# Patient Record
Sex: Female | Born: 1960 | Race: Black or African American | Hispanic: No | Smoking: Never smoker
Health system: Southern US, Community
[De-identification: ages and names within clinical notes are randomized; demographics above are authoritative.]

## PROBLEM LIST (undated history)

## (undated) DIAGNOSIS — J45909 Unspecified asthma, uncomplicated: Secondary | ICD-10-CM

## (undated) HISTORY — PX: DENTAL SURGERY: SHX609

---

## 2004-02-08 ENCOUNTER — Emergency Department: Payer: Self-pay | Admitting: Unknown Physician Specialty

## 2006-07-22 ENCOUNTER — Emergency Department: Payer: Self-pay | Admitting: Emergency Medicine

## 2006-08-05 ENCOUNTER — Emergency Department: Payer: Self-pay | Admitting: Emergency Medicine

## 2007-06-07 ENCOUNTER — Emergency Department: Payer: Self-pay | Admitting: Emergency Medicine

## 2007-08-17 ENCOUNTER — Emergency Department: Payer: Self-pay

## 2007-10-14 ENCOUNTER — Emergency Department: Payer: Self-pay | Admitting: Emergency Medicine

## 2009-04-18 ENCOUNTER — Emergency Department: Payer: Self-pay | Admitting: Emergency Medicine

## 2009-08-15 ENCOUNTER — Emergency Department: Payer: Self-pay | Admitting: Emergency Medicine

## 2010-02-19 ENCOUNTER — Emergency Department: Payer: Self-pay | Admitting: Emergency Medicine

## 2010-07-26 ENCOUNTER — Emergency Department: Payer: Self-pay | Admitting: Internal Medicine

## 2010-10-10 ENCOUNTER — Emergency Department: Payer: Self-pay | Admitting: *Deleted

## 2011-08-13 ENCOUNTER — Emergency Department: Payer: Self-pay | Admitting: Emergency Medicine

## 2011-09-15 ENCOUNTER — Emergency Department: Payer: Self-pay | Admitting: Emergency Medicine

## 2011-09-15 LAB — URINALYSIS, COMPLETE
Bacteria: NONE SEEN
Ketone: NEGATIVE
Leukocyte Esterase: NEGATIVE
Nitrite: NEGATIVE
Protein: 30
RBC,UR: 14 /HPF (ref 0–5)
Specific Gravity: 1.028 (ref 1.003–1.030)
WBC UR: 6 /HPF (ref 0–5)

## 2011-09-17 LAB — URINE CULTURE

## 2012-04-02 ENCOUNTER — Emergency Department: Payer: Self-pay | Admitting: Internal Medicine

## 2012-04-02 LAB — URINALYSIS, COMPLETE
Ketone: NEGATIVE
Nitrite: NEGATIVE
Specific Gravity: 1.01 (ref 1.003–1.030)

## 2012-04-04 LAB — URINE CULTURE

## 2012-05-11 ENCOUNTER — Emergency Department: Payer: Self-pay | Admitting: Emergency Medicine

## 2013-03-23 ENCOUNTER — Emergency Department: Payer: Self-pay | Admitting: Emergency Medicine

## 2013-04-02 ENCOUNTER — Emergency Department: Payer: Self-pay | Admitting: Emergency Medicine

## 2013-11-16 ENCOUNTER — Emergency Department: Payer: Self-pay | Admitting: Emergency Medicine

## 2013-11-16 LAB — URINALYSIS, COMPLETE
Bacteria: NONE SEEN
Bilirubin,UR: NEGATIVE
Blood: NEGATIVE
Glucose,UR: NEGATIVE mg/dL (ref 0–75)
Ketone: NEGATIVE
LEUKOCYTE ESTERASE: NEGATIVE
Nitrite: NEGATIVE
PH: 7 (ref 4.5–8.0)
PROTEIN: NEGATIVE
Specific Gravity: 1.005 (ref 1.003–1.030)
Squamous Epithelial: <1
WBC UR: 1 /HPF (ref 0–5)

## 2013-11-16 LAB — WET PREP, GENITAL

## 2013-11-17 LAB — GC/CHLAMYDIA PROBE AMP

## 2013-11-24 ENCOUNTER — Emergency Department: Payer: Self-pay | Admitting: Student

## 2013-11-24 LAB — URINALYSIS, COMPLETE
Bacteria: NONE SEEN
Bilirubin,UR: NEGATIVE
Glucose,UR: NEGATIVE mg/dL (ref 0–75)
Ketone: NEGATIVE
Leukocyte Esterase: NEGATIVE
Nitrite: NEGATIVE
PH: 7 (ref 4.5–8.0)
Protein: NEGATIVE
RBC,UR: 1 /HPF (ref 0–5)
SPECIFIC GRAVITY: 1.01 (ref 1.003–1.030)
Squamous Epithelial: 1
WBC UR: 1 /HPF (ref 0–5)

## 2014-03-01 ENCOUNTER — Emergency Department: Payer: Self-pay | Admitting: Emergency Medicine

## 2014-04-27 ENCOUNTER — Emergency Department: Payer: Self-pay | Admitting: Emergency Medicine

## 2014-05-24 ENCOUNTER — Emergency Department
Admit: 2014-05-24 | Disposition: A | Discharge status: Home / Self Care | Payer: Self-pay | Admitting: Emergency Medicine

## 2014-08-11 ENCOUNTER — Encounter: Payer: Self-pay | Admitting: Emergency Medicine

## 2014-08-11 ENCOUNTER — Emergency Department
Admission: EM | Admit: 2014-08-11 | Discharge: 2014-08-11 | Disposition: A | Discharge status: Home / Self Care | Payer: 59 | Attending: Student | Admitting: Student

## 2014-08-11 DIAGNOSIS — Z791 Long term (current) use of non-steroidal anti-inflammatories (NSAID): Secondary | ICD-10-CM | POA: Insufficient documentation

## 2014-08-11 DIAGNOSIS — Y92410 Unspecified street and highway as the place of occurrence of the external cause: Secondary | ICD-10-CM | POA: Insufficient documentation

## 2014-08-11 DIAGNOSIS — S39012A Strain of muscle, fascia and tendon of lower back, initial encounter: Secondary | ICD-10-CM | POA: Diagnosis not present

## 2014-08-11 DIAGNOSIS — S161XXA Strain of muscle, fascia and tendon at neck level, initial encounter: Secondary | ICD-10-CM | POA: Diagnosis not present

## 2014-08-11 DIAGNOSIS — S5012XA Contusion of left forearm, initial encounter: Secondary | ICD-10-CM | POA: Insufficient documentation

## 2014-08-11 DIAGNOSIS — Y9389 Activity, other specified: Secondary | ICD-10-CM | POA: Insufficient documentation

## 2014-08-11 DIAGNOSIS — Y998 Other external cause status: Secondary | ICD-10-CM | POA: Insufficient documentation

## 2014-08-11 DIAGNOSIS — S199XXA Unspecified injury of neck, initial encounter: Secondary | ICD-10-CM | POA: Diagnosis present

## 2014-08-11 HISTORY — DX: Unspecified asthma, uncomplicated: J45.909

## 2014-08-11 MED ORDER — TRAMADOL HCL 50 MG PO TABS: 50.0000 mg | ORAL_TABLET | Freq: Four times a day (QID) | ORAL | Status: DC | PRN

## 2014-08-11 MED ORDER — MELOXICAM 15 MG PO TABS: 15.0000 mg | ORAL_TABLET | Freq: Every day | ORAL | Status: DC

## 2014-08-11 MED ORDER — CYCLOBENZAPRINE HCL 10 MG PO TABS: 10.0000 mg | ORAL_TABLET | Freq: Three times a day (TID) | ORAL | Status: DC | PRN
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
## 2014-08-11 NOTE — Discharge Instructions (Signed)

## 2014-08-11 NOTE — ED Notes (Signed)
Pt alert and oriented X4, active, cooperative, pt in NAD. RR even and unlabored, color WNL.  Pt informed to return if any life threatening symptoms occur.   
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
## 2014-08-15 ENCOUNTER — Emergency Department: Payer: 59

## 2014-08-15 ENCOUNTER — Emergency Department
Admission: EM | Admit: 2014-08-15 | Discharge: 2014-08-15 | Disposition: A | Discharge status: Home / Self Care | Payer: 59 | Attending: Emergency Medicine | Admitting: Emergency Medicine

## 2014-08-15 DIAGNOSIS — S199XXD Unspecified injury of neck, subsequent encounter: Secondary | ICD-10-CM | POA: Insufficient documentation

## 2014-08-15 DIAGNOSIS — R52 Pain, unspecified: Secondary | ICD-10-CM

## 2014-08-15 DIAGNOSIS — S3992XD Unspecified injury of lower back, subsequent encounter: Secondary | ICD-10-CM | POA: Insufficient documentation

## 2014-08-15 DIAGNOSIS — S79911D Unspecified injury of right hip, subsequent encounter: Secondary | ICD-10-CM | POA: Diagnosis not present

## 2014-08-15 DIAGNOSIS — Z791 Long term (current) use of non-steroidal anti-inflammatories (NSAID): Secondary | ICD-10-CM | POA: Insufficient documentation

## 2014-08-15 DIAGNOSIS — M549 Dorsalgia, unspecified: Secondary | ICD-10-CM | POA: Diagnosis present

## 2014-08-15 MED ORDER — HYDROCODONE-ACETAMINOPHEN 5-325 MG PO TABS: 1.0000 | ORAL_TABLET | ORAL | Status: DC | PRN

## 2014-08-15 MED ORDER — KETOROLAC TROMETHAMINE 60 MG/2ML IM SOLN
60.0000 mg | Freq: Once | INTRAMUSCULAR | Status: AC
  Administered 2014-08-15: 60 mg via INTRAMUSCULAR

## 2014-08-15 MED ORDER — METHOCARBAMOL 750 MG PO TABS: 750.0000 mg | ORAL_TABLET | Freq: Four times a day (QID) | ORAL | Status: DC

## 2014-08-15 MED ORDER — KETOROLAC TROMETHAMINE 60 MG/2ML IM SOLN
INTRAMUSCULAR | Status: AC
  Filled 2014-08-15: qty 2
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
## 2014-12-21 ENCOUNTER — Emergency Department
Admission: EM | Admit: 2014-12-21 | Discharge: 2014-12-21 | Disposition: A | Discharge status: Home / Self Care | Payer: Self-pay | Attending: Emergency Medicine | Admitting: Emergency Medicine

## 2014-12-21 ENCOUNTER — Encounter: Payer: Self-pay | Admitting: Urgent Care

## 2014-12-21 DIAGNOSIS — Z79899 Other long term (current) drug therapy: Secondary | ICD-10-CM | POA: Insufficient documentation

## 2014-12-21 DIAGNOSIS — K047 Periapical abscess without sinus: Secondary | ICD-10-CM | POA: Insufficient documentation

## 2014-12-21 DIAGNOSIS — Z791 Long term (current) use of non-steroidal anti-inflammatories (NSAID): Secondary | ICD-10-CM | POA: Insufficient documentation

## 2014-12-21 MED ORDER — CLINDAMYCIN HCL 150 MG PO CAPS
300.0000 mg | ORAL_CAPSULE | Freq: Once | ORAL | Status: AC
Start: 2014-12-21 — End: 2014-12-21
  Administered 2014-12-21: 300 mg via ORAL
  Filled 2014-12-21: qty 2

## 2014-12-21 MED ORDER — CLINDAMYCIN HCL 300 MG PO CAPS: 300.0000 mg | ORAL_CAPSULE | Freq: Four times a day (QID) | ORAL | Status: DC

## 2014-12-21 MED ORDER — HYDROCODONE-ACETAMINOPHEN 5-325 MG PO TABS: 1.0000 | ORAL_TABLET | Freq: Three times a day (TID) | ORAL | Status: DC | PRN

## 2014-12-21 NOTE — Discharge Instructions (Signed)
Dental Abscess A dental abscess is a collection of pus in or around a tooth. CAUSES This condition is caused by a bacterial infection around the root of the tooth that involves the inner part of the tooth (pulp). It may result from:  Severe tooth decay.  Trauma to the tooth that allows bacteria to enter into the pulp, such as a broken or chipped tooth.  Severe gum disease around a tooth. SYMPTOMS Symptoms of this condition include:  Severe pain in and around the infected tooth.  Swelling and redness around the infected tooth, in the mouth, or in the face.  Tenderness.  Pus drainage.  Bad breath.  Bitter taste in the mouth.  Difficulty swallowing.  Difficulty opening the mouth.  Nausea.  Vomiting.  Chills.  Swollen neck glands.  Fever. DIAGNOSIS This condition is diagnosed with examination of the infected tooth. During the exam, your dentist may tap on the infected tooth. Your dentist will also ask about your medical and dental history and may order X-rays. TREATMENT This condition is treated by eliminating the infection. This may be done with:  Antibiotic medicine.  A root canal. This may be performed to save the tooth.  Pulling (extracting) the tooth. This may also involve draining the abscess. This is done if the tooth cannot be saved. HOME CARE INSTRUCTIONS  Take medicines only as directed by your dentist.  If you were prescribed antibiotic medicine, finish all of it even if you start to feel better.  Rinse your mouth (gargle) often with salt water to relieve pain or swelling.  Do not drive or operate heavy machinery while taking pain medicine.  Do not apply heat to the outside of your mouth.  Keep all follow-up visits as directed by your dentist. This is important. SEEK MEDICAL CARE IF:  Your pain is worse and is not helped by medicine. SEEK IMMEDIATE MEDICAL CARE IF:  You have a fever or chills.  Your symptoms suddenly get worse.  You have a  very bad headache.  You have problems breathing or swallowing.  You have trouble opening your mouth.  You have swelling in your neck or around your eye.   This information is not intended to replace advice given to you by your health care provider. Make sure you discuss any questions you have with your health care provider.   Document Released: 02/04/2005 Document Revised: 06/21/2014 Document Reviewed: 02/01/2014 Elsevier Interactive Patient Education [employer]2016 [employer]   Take the antibiotic as directed, until completely gone. Apply warm compresses to reduce swelling. Follow-up with your dental provider or primary care provider for further care.

## 2014-12-21 NOTE — ED Notes (Signed)
Patient presents with c/o RIGHT side otalgia and reports of fullness x 2 months. (+) facial swelling noted. Patient reports tooth extraction with subsequent extraction x 4 months ago. Denies fever. Patient states, "It feels like there is something moving in my ear and it makes me dizzy sometimes. It is so much worse tonight."

## 2014-12-21 NOTE — ED Provider Notes (Signed)
[facility] Emergency Department Provider Note ____________________________________________  Time seen: 2305  I have reviewed the triage vital signs and the nursing notes.  HISTORY  Chief Complaint  Otalgia  HPI Debbie Leon is a 54 y.o. female reports to the ED with complaints of right sided ear pain intermittently and fullness over the last 2 months. She is also noticed some sudden onset of facial swelling this evening.She describes a fullness in the ear and intermittent sensation of fluid in the eardrum. Denies any discharge from the ear, or hearing changes. She also denies any significant fevers, chills, or sweats. Of significance is that she had a dental extraction about 4 months prior, and does note that it was a difficult extraction. She speculates that she may even have had a piece of tooth left in her lower jaw. She rates the pain which originates in her lower right jaw about a 10 out of 10. She also notes swelling to the right side of the face towards the ear.  Past Medical History  Diagnosis Date  . Asthma     There are no active problems to display for this patient.   History reviewed. No pertinent past surgical history.  Current Outpatient Rx  Name  Route  Sig  Dispense  Refill  . clindamycin (CLEOCIN) 300 MG capsule   Oral   Take 1 capsule (300 mg total) by mouth 4 (four) times daily.   40 capsule   0   . cyclobenzaprine (FLEXERIL) 10 MG tablet   Oral   Take 1 tablet (10 mg total) by mouth 3 (three) times daily as needed for muscle spasms.   30 tablet   0   . HYDROcodone-acetaminophen (NORCO) 5-325 MG tablet   Oral   Take 1 tablet by mouth every 8 (eight) hours as needed for moderate pain.   10 tablet   0   . meloxicam (MOBIC) 15 MG tablet   Oral   Take 1 tablet (15 mg total) by mouth daily.   30 tablet   2   . methocarbamol (ROBAXIN) 750 MG tablet   Oral   Take 1 tablet (750 mg total) by mouth 4 (four) times daily.   40  tablet   0     Stop the cyclobenzaprine.   . traMADol (ULTRAM) 50 MG tablet   Oral   Take 1 tablet (50 mg total) by mouth every 6 (six) hours as needed.   9 tablet   0    Allergies Dye fdc red  No family history on file.  Social History Social History  Substance Use Topics  . Smoking status: Never Smoker   . Smokeless tobacco: None  . Alcohol Use: No   Review of Systems  Constitutional: Negative for fever. Eyes: Negative for visual changes. ENT: Negative for sore throat. Right ear ache and facial swelling as above. Cardiovascular: Negative for chest pain. Respiratory: Negative for shortness of breath. Gastrointestinal: Negative for abdominal pain, vomiting and diarrhea. Genitourinary: Negative for dysuria. Musculoskeletal: Negative for back pain. Skin: Negative for rash. Neurological: Negative for headaches, focal weakness or numbness. ____________________________________________  PHYSICAL EXAM:  VITAL SIGNS: ED Triage Vitals  Enc Vitals Group     BP 12/21/14 2227 141/78 mmHg     Pulse Rate 12/21/14 2227 66     Resp 12/21/14 2227 16     Temp 12/21/14 2227 97.8 F (36.6 C)     Temp Source 12/21/14 2227 Oral     SpO2 12/21/14  2227 100 %     Weight 12/21/14 2227 190 lb (86.183 kg)     Height 12/21/14 2227 5\' 5"  (1.651 m)     Head Cir --      Peak Flow --      Pain Score 12/21/14 2229 10     Pain Loc --      Pain Edu? --      Excl. in GC? --    Constitutional: Alert and oriented. Well appearing and in no distress. Head: Normocephalic and atraumatic, except for mild right-sided facial swelling from the lower jaw to the zygomatic cheek.      Eyes: Conjunctivae are normal. PERRL. Normal extraocular movements      Ears: Canals clear. TMs intact bilaterally. No effusion or bulging noted.   Nose: No congestion/rhinorrhea.   Mouth/Throat: Mucous membranes are moist. Edentulous lower right jaw from the canine back. Mild tenderness to the lower right buccal  mucosa.    Neck: Supple. No thyromegaly. Hematological/Lymphatic/Immunological: No cervical lymphadenopathy. Cardiovascular: Normal rate, regular rhythm.  Respiratory: Normal respiratory effort. No wheezes/rales/rhonchi. Gastrointestinal: Soft and nontender. No distention. Musculoskeletal: Nontender with normal range of motion in all extremities.  Neurologic:  Normal gait without ataxia. Normal speech and language. No gross focal neurologic deficits are appreciated. Skin:  Skin is warm, dry and intact. No rash noted. Psychiatric: Mood and affect are normal. Patient exhibits appropriate insight and judgment. ____________________________________________  PROCEDURES  Clindamycin 300 mg PO ____________________________________________  INITIAL IMPRESSION / ASSESSMENT AND PLAN / ED COURSE  Acute dental abscess on presentation. Patient will be discharged with prescription for clindamycin dose as directed. She is encouraged to follow up with her dental provider for reevaluation and panoramic x-rays. She cried with the prescription for Norco to dose as needed for acute pain. Return instructions are provided. ____________________________________________  FINAL CLINICAL IMPRESSION(S) / ED DIAGNOSES  Final diagnoses:  Dental abscess      Lissa HoardJenise V Bacon Kelsye Loomer, PA-C 12/21/14 2359  Darien Ramusavid W Kaminski, MD 12/26/14 1356

## 2014-12-28 ENCOUNTER — Emergency Department
Admission: EM | Admit: 2014-12-28 | Discharge: 2014-12-28 | Disposition: A | Discharge status: Home / Self Care | Payer: Self-pay | Attending: Emergency Medicine | Admitting: Emergency Medicine

## 2014-12-28 ENCOUNTER — Encounter: Payer: Self-pay | Admitting: *Deleted

## 2014-12-28 DIAGNOSIS — N390 Urinary tract infection, site not specified: Secondary | ICD-10-CM | POA: Insufficient documentation

## 2014-12-28 DIAGNOSIS — Z79899 Other long term (current) drug therapy: Secondary | ICD-10-CM | POA: Insufficient documentation

## 2014-12-28 DIAGNOSIS — Z791 Long term (current) use of non-steroidal anti-inflammatories (NSAID): Secondary | ICD-10-CM | POA: Insufficient documentation

## 2014-12-28 DIAGNOSIS — Z792 Long term (current) use of antibiotics: Secondary | ICD-10-CM | POA: Insufficient documentation

## 2014-12-28 DIAGNOSIS — R319 Hematuria, unspecified: Secondary | ICD-10-CM

## 2014-12-28 DIAGNOSIS — R300 Dysuria: Secondary | ICD-10-CM

## 2014-12-28 LAB — URINALYSIS COMPLETE WITH MICROSCOPIC (ARMC ONLY)
Bilirubin Urine: NEGATIVE
Glucose, UA: NEGATIVE mg/dL
Ketones, ur: NEGATIVE mg/dL
Nitrite: NEGATIVE
PH: 5 (ref 5.0–8.0)
PROTEIN: 100 mg/dL — AB
SPECIFIC GRAVITY, URINE: 1.02 (ref 1.005–1.030)

## 2014-12-28 MED ORDER — CIPROFLOXACIN HCL 500 MG PO TABS
500.0000 mg | ORAL_TABLET | Freq: Once | ORAL | Status: AC
  Administered 2014-12-28: 500 mg via ORAL
  Filled 2014-12-28: qty 1

## 2014-12-28 MED ORDER — CIPROFLOXACIN HCL 500 MG PO TABS: 500.0000 mg | ORAL_TABLET | Freq: Two times a day (BID) | ORAL | Status: DC

## 2014-12-28 NOTE — ED Notes (Addendum)
Pt reports blood in urine tonight.  Pt has dysuria.  No back pain.

## 2014-12-28 NOTE — Discharge Instructions (Signed)
1. Take antibiotic as prescribed (Cipro 500 mg twice daily 7 days). 2. Return to the ER for worsening symptoms, persistent vomiting, fever or other concerns.  Dysuria Dysuria is pain or discomfort while urinating. The pain or discomfort may be felt in the tube that carries urine out of the bladder (urethra) or in the surrounding tissue of the genitals. The pain may also be felt in the groin area, lower abdomen, and lower back. You may have to urinate frequently or have the sudden feeling that you have to urinate (urgency). Dysuria can affect both men and women, but is more common in women. Dysuria can be caused by many different things, including:  Urinary tract infection in women.  Infection of the kidney or bladder.  Kidney stones or bladder stones.  Certain sexually transmitted infections (STIs), such as chlamydia.  Dehydration.  Inflammation of the vagina.  Use of certain medicines.  Use of certain soaps or scented products that cause irritation. HOME CARE INSTRUCTIONS Watch your dysuria for any changes. The following actions may help to reduce any discomfort you are feeling:  Drink enough fluid to keep your urine clear or pale yellow.  Empty your bladder often. Avoid holding urine for long periods of time.  After a bowel movement or urination, women should cleanse from front to back, using each tissue only once.  Empty your bladder after sexual intercourse.  Take medicines only as directed by your health care provider.  If you were prescribed an antibiotic medicine, finish it all even if you start to feel better.  Avoid caffeine, tea, and alcohol. They can irritate the bladder and make dysuria worse. In men, alcohol may irritate the prostate.  Keep all follow-up visits as directed by your health care provider. This is important.  If you had any tests done to find the cause of dysuria, it is your responsibility to obtain your test results. Ask the lab or department  performing the test when and how you will get your results. Talk with your health care provider if you have any questions about your results. SEEK MEDICAL CARE IF:  You develop pain in your back or sides.  You have a fever.  You have nausea or vomiting.  You have blood in your urine.  You are not urinating as often as you usually do. SEEK IMMEDIATE MEDICAL CARE IF:  You pain is severe and not relieved with medicines.  You are unable to hold down any fluids.  You or someone else notices a change in your mental function.  You have a rapid heartbeat at rest.  You have shaking or chills.  You feel extremely weak.   This information is not intended to replace advice given to you by your health care provider. Make sure you discuss any questions you have with your health care provider.   Document Released: 11/03/2003 Document Revised: 02/25/2014 Document Reviewed: 09/30/2013 Elsevier Interactive Patient Education 2016 [employer]  Urinary Tract Infection Urinary tract infections (UTIs) can develop anywhere along your urinary tract. Your urinary tract is your body's drainage system for removing wastes and extra water. Your urinary tract includes two kidneys, two ureters, a bladder, and a urethra. Your kidneys are a pair of bean-shaped organs. Each kidney is about the size of your fist. They are located below your ribs, one on each side of your spine. CAUSES Infections are caused by microbes, which are microscopic organisms, including fungi, viruses, and bacteria. These organisms are so small that they can only be  seen through a microscope. Bacteria are the microbes that most commonly cause UTIs. SYMPTOMS  Symptoms of UTIs may vary by age and gender of the patient and by the location of the infection. Symptoms in young women typically include a frequent and intense urge to urinate and a painful, burning feeling in the bladder or urethra during urination. Older women and men are more  likely to be tired, shaky, and weak and have muscle aches and abdominal pain. A fever may mean the infection is in your kidneys. Other symptoms of a kidney infection include pain in your back or sides below the ribs, nausea, and vomiting. DIAGNOSIS To diagnose a UTI, your caregiver will ask you about your symptoms. Your caregiver will also ask you to provide a urine sample. The urine sample will be tested for bacteria and white blood cells. White blood cells are made by your body to help fight infection. TREATMENT  Typically, UTIs can be treated with medication. Because most UTIs are caused by a bacterial infection, they usually can be treated with the use of antibiotics. The choice of antibiotic and length of treatment depend on your symptoms and the type of bacteria causing your infection. HOME CARE INSTRUCTIONS  If you were prescribed antibiotics, take them exactly as your caregiver instructs you. Finish the medication even if you feel better after you have only taken some of the medication.  Drink enough water and fluids to keep your urine clear or pale yellow.  Avoid caffeine, tea, and carbonated beverages. They tend to irritate your bladder.  Empty your bladder often. Avoid holding urine for long periods of time.  Empty your bladder before and after sexual intercourse.  After a bowel movement, women should cleanse from front to back. Use each tissue only once. SEEK MEDICAL CARE IF:   You have back pain.  You develop a fever.  Your symptoms do not begin to resolve within 3 days. SEEK IMMEDIATE MEDICAL CARE IF:   You have severe back pain or lower abdominal pain.  You develop chills.  You have nausea or vomiting.  You have continued burning or discomfort with urination. MAKE SURE YOU:   Understand these instructions.  Will watch your condition.  Will get help right away if you are not doing well or get worse.   This information is not intended to replace advice given to  you by your health care provider. Make sure you discuss any questions you have with your health care provider.   Document Released: 11/14/2004 Document Revised: 10/26/2014 Document Reviewed: 03/15/2011 Elsevier Interactive Patient Education [employer]

## 2014-12-28 NOTE — ED Provider Notes (Signed)
[facility] Emergency Department Provider Note  ____________________________________________  Time seen: Approximately 3:22 AM  I have reviewed the triage vital signs and the nursing notes.   HISTORY  Chief Complaint Hematuria   HPI Debbie Leon is a 54 y.o. female who presents to the ED from home with a chief complaint of hematuria.Patient reports symptoms of dysuria and hematuria x one day. Symptoms associated with suprapubic discomfort. Patient denies fever, chills, flank pain, chest pain, nausea, vomiting, shortness of breath. Denies use of anticoagulants. Denies recent travel or trauma. Nothing makes her symptoms better or worse.   Past Medical History  Diagnosis Date  . Asthma     There are no active problems to display for this patient.   No past surgical history on file.  Current Outpatient Rx  Name  Route  Sig  Dispense  Refill  . clindamycin (CLEOCIN) 300 MG capsule   Oral   Take 1 capsule (300 mg total) by mouth 4 (four) times daily.   40 capsule   0   . cyclobenzaprine (FLEXERIL) 10 MG tablet   Oral   Take 1 tablet (10 mg total) by mouth 3 (three) times daily as needed for muscle spasms.   30 tablet   0   . HYDROcodone-acetaminophen (NORCO) 5-325 MG tablet   Oral   Take 1 tablet by mouth every 8 (eight) hours as needed for moderate pain.   10 tablet   0   . meloxicam (MOBIC) 15 MG tablet   Oral   Take 1 tablet (15 mg total) by mouth daily.   30 tablet   2   . methocarbamol (ROBAXIN) 750 MG tablet   Oral   Take 1 tablet (750 mg total) by mouth 4 (four) times daily.   40 tablet   0     Stop the cyclobenzaprine.   . traMADol (ULTRAM) 50 MG tablet   Oral   Take 1 tablet (50 mg total) by mouth every 6 (six) hours as needed.   9 tablet   0     Allergies Dye fdc red  No family history on file.  Social History Social History  Substance Use Topics  . Smoking status: Never Smoker   . Smokeless tobacco: None   . Alcohol Use: No    Review of Systems Constitutional: No fever/chills Eyes: No visual changes. ENT: No sore throat. Cardiovascular: Denies chest pain. Respiratory: Denies shortness of breath. Gastrointestinal: Positive for suprapubic abdominal pain.  No nausea, no vomiting.  No diarrhea.  No constipation. Genitourinary: Positive for dysuria. Musculoskeletal: Negative for back pain. Skin: Negative for rash. Neurological: Negative for headaches, focal weakness or numbness.  10-point ROS otherwise negative.  ____________________________________________   PHYSICAL EXAM:  VITAL SIGNS: ED Triage Vitals  Enc Vitals Group     BP 12/28/14 0031 140/85 mmHg     Pulse Rate 12/28/14 0031 76     Resp --      Temp 12/28/14 0031 97.7 F (36.5 C)     Temp Source 12/28/14 0031 Oral     SpO2 12/28/14 0031 100 %     Weight 12/28/14 0031 190 lb (86.183 kg)     Height 12/28/14 0031 5' 5.5" (1.664 m)     Head Cir --      Peak Flow --      Pain Score 12/28/14 0037 10     Pain Loc --      Pain Edu? --  Excl. in GC? --     Constitutional: Alert and oriented. Well appearing and in no acute distress. Eyes: Conjunctivae are normal. PERRL. EOMI. Head: Atraumatic. Nose: No congestion/rhinnorhea. Mouth/Throat: Mucous membranes are moist.  Oropharynx non-erythematous. Neck: No stridor.   Cardiovascular: Normal rate, regular rhythm. Grossly normal heart sounds.  Good peripheral circulation. Respiratory: Normal respiratory effort.  No retractions. Lungs CTAB. Gastrointestinal: Soft and nontender. No distention. No abdominal bruits. No CVA tenderness. Musculoskeletal: No lower extremity tenderness nor edema.  No joint effusions. Neurologic:  Normal speech and language. No gross focal neurologic deficits are appreciated. No gait instability. Skin:  Skin is warm, dry and intact. No rash noted. Psychiatric: Mood and affect are normal. Speech and behavior are  normal.  ____________________________________________   LABS (all labs ordered are listed, but only abnormal results are displayed)  Labs Reviewed  URINALYSIS COMPLETEWITH MICROSCOPIC ([facility] ONLY) - Abnormal; Notable for the following:    Color, Urine RED (*)    APPearance CLOUDY (*)    Hgb urine dipstick 2+ (*)    Protein, ur 100 (*)    Leukocytes, UA 1+ (*)    Bacteria, UA MANY (*)    Squamous Epithelial / LPF 6-30 (*)    All other components within normal limits   ____________________________________________  EKG  None ____________________________________________  RADIOLOGY  None ____________________________________________   PROCEDURES  Procedure(s) performed: None  Critical Care performed: No  ____________________________________________   INITIAL IMPRESSION / ASSESSMENT AND PLAN / ED COURSE  Pertinent labs & imaging results that were available during my care of the patient were reviewed by me and considered in my medical decision making (see chart for details).  54 year old who presents with hematuria. Finishing clindamycin for unrelated dental abscess diagnosed last week. Clinical presentation atypical for nephrolithiasis. Will initiate antibiotic treatment with Cipro. Strict return precautions given. Patient verbalizes understanding and agrees with plan of care. ____________________________________________   FINAL CLINICAL IMPRESSION(S) / ED DIAGNOSES  Final diagnoses:  Hematuria  UTI (lower urinary tract infection)  Dysuria      Irean Hong, MD 12/28/14 [phone]

## 2015-02-23 ENCOUNTER — Emergency Department
Admission: EM | Admit: 2015-02-23 | Discharge: 2015-02-23 | Disposition: A | Discharge status: Home / Self Care | Payer: Self-pay | Attending: Emergency Medicine | Admitting: Emergency Medicine

## 2015-02-23 ENCOUNTER — Encounter: Payer: Self-pay | Admitting: Emergency Medicine

## 2015-02-23 DIAGNOSIS — R21 Rash and other nonspecific skin eruption: Secondary | ICD-10-CM | POA: Insufficient documentation

## 2015-02-23 DIAGNOSIS — Z79899 Other long term (current) drug therapy: Secondary | ICD-10-CM | POA: Insufficient documentation

## 2015-02-23 DIAGNOSIS — Z791 Long term (current) use of non-steroidal anti-inflammatories (NSAID): Secondary | ICD-10-CM | POA: Insufficient documentation

## 2015-02-23 DIAGNOSIS — Z792 Long term (current) use of antibiotics: Secondary | ICD-10-CM | POA: Insufficient documentation

## 2015-02-23 MED ORDER — PREDNISONE 10 MG (21) PO TBPK: ORAL_TABLET | ORAL | Status: DC

## 2015-02-23 MED ORDER — FAMOTIDINE 20 MG PO TABS: 20.0000 mg | ORAL_TABLET | Freq: Every day | ORAL | Status: DC

## 2015-02-23 MED ORDER — FAMOTIDINE 20 MG PO TABS
20.0000 mg | ORAL_TABLET | Freq: Once | ORAL | Status: AC
  Administered 2015-02-23: 20 mg via ORAL
  Filled 2015-02-23: qty 1

## 2015-02-23 MED ORDER — DIAZEPAM 2 MG PO TABS: 2.0000 mg | ORAL_TABLET | Freq: Three times a day (TID) | ORAL | Status: DC | PRN

## 2015-02-23 MED ORDER — DIAZEPAM 2 MG PO TABS
2.0000 mg | ORAL_TABLET | Freq: Once | ORAL | Status: AC
  Administered 2015-02-23: 2 mg via ORAL
  Filled 2015-02-23: qty 1

## 2015-02-23 MED ORDER — DEXAMETHASONE SODIUM PHOSPHATE 10 MG/ML IJ SOLN
10.0000 mg | Freq: Once | INTRAMUSCULAR | Status: AC
  Administered 2015-02-23: 10 mg via INTRAMUSCULAR
  Filled 2015-02-23: qty 1

## 2015-02-23 NOTE — ED Notes (Signed)
Pt states she slept on friends couch and that she developed rash.  Pt states she also accidentally ate shrimp which she is allergic to when this happened as well.  Pt states she has been taking benadryl.

## 2015-02-23 NOTE — ED Provider Notes (Signed)
[facility] Emergency Department Provider Note ____________________________________________  Time seen: Approximately 10:02 PM  I have reviewed the triage vital signs and the nursing notes.   HISTORY  Chief Complaint Rash   HPI Debbie Leon is a 55 y.o. female who presents to the emergency department for evaluation of a rash that started on Saturday. She states that she slept on someone's couch and then awakened with an itchy rash to her neck. 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 She denies difficulty breathing, throat itching or swelling, or nausea/vomiting. She has been taking Benadryl without relief.   Past Medical History  Diagnosis Date  . Asthma     There are no active problems to display for this patient.   History reviewed. No pertinent past surgical history.  Current Outpatient Rx  Name  Route  Sig  Dispense  Refill  . ciprofloxacin (CIPRO) 500 MG tablet   Oral   Take 1 tablet (500 mg total) by mouth 2 (two) times daily.   14 tablet   0   . clindamycin (CLEOCIN) 300 MG capsule   Oral   Take 1 capsule (300 mg total) by mouth 4 (four) times daily.   40 capsule   0   . cyclobenzaprine (FLEXERIL) 10 MG tablet   Oral   Take 1 tablet (10 mg total) by mouth 3 (three) times daily as needed for muscle spasms.   30 tablet   0   . diazepam (VALIUM) 2 MG tablet   Oral   Take 1 tablet (2 mg total) by mouth every 8 (eight) hours as needed.   12 tablet   0   . famotidine (PEPCID) 20 MG tablet   Oral   Take 1 tablet (20 mg total) by mouth daily.   14 tablet   0   . HYDROcodone-acetaminophen (NORCO) 5-325 MG tablet   Oral   Take 1 tablet by mouth every 8 (eight) hours as needed for moderate pain.   10 tablet   0   . meloxicam (MOBIC) 15 MG tablet   Oral   Take 1 tablet (15 mg total) by mouth daily.   30 tablet    2   . methocarbamol (ROBAXIN) 750 MG tablet   Oral   Take 1 tablet (750 mg total) by mouth 4 (four) times daily.   40 tablet   0     Stop the cyclobenzaprine.   . predniSONE (STERAPRED UNI-PAK 21 TAB) 10 MG (21) TBPK tablet      Take 6 tablets on day 1 Take 5 tablets on day 2 Take 4 tablets on day 3 Take 3 tablets on day 4 Take 2 tablets on day 5 Take 1 tablet on day 6   21 tablet   0   . traMADol (ULTRAM) 50 MG tablet   Oral   Take 1 tablet (50 mg total) by mouth every 6 (six) hours as needed.   9 tablet   0     Allergies Dye fdc red and Shrimp  History reviewed. No pertinent family history.  Social History Social History  Substance Use Topics  . Smoking status: Never Smoker   . Smokeless tobacco: None  . Alcohol Use: No    Review of Systems   Constitutional: No fever/chills Eyes: No visual changes. ENT: No congestion or rhinorrhea Cardiovascular: Denies chest pain.  Respiratory: Denies shortness of breath. Gastrointestinal: No abdominal pain.  No nausea, no vomiting.  No diarrhea.  No constipation. Genitourinary: Negative for dysuria. Musculoskeletal: Negative for back pain. Skin: Positive for rash Neurological: Negative for headaches, focal weakness or numbness.  10-point ROS otherwise negative.  ____________________________________________   PHYSICAL EXAM:  VITAL SIGNS: ED Triage Vitals  Enc Vitals Group     BP 02/23/15 2143 141/108 mmHg     Pulse Rate 02/23/15 2143 82     Resp 02/23/15 2143 18     Temp 02/23/15 2143 97.9 F (36.6 C)     Temp Source 02/23/15 2143 Oral     SpO2 02/23/15 2143 100 %     Weight 02/23/15 2143 195 lb (88.451 kg)     Height 02/23/15 2143 5\' 5"  (1.651 m)     Head Cir --      Peak Flow --      Pain Score --      Pain Loc --      Pain Edu? --      Excl. in GC? --     Constitutional: Alert and oriented. Well appearing and in no acute distress. Eyes: Conjunctivae are normal. PERRL. EOMI. Head:  Atraumatic. Nose: No congestion/rhinnorhea. Mouth/Throat: Mucous membranes are moist.  Oropharynx non-erythematous. No oral lesions. Neck: No stridor. Cardiovascular: Normal rate, regular rhythm.  Good peripheral circulation. Respiratory: Normal respiratory effort.  No retractions. Lungs CTAB. Gastrointestinal: Soft and nontender. No distention. No abdominal bruits.  Musculoskeletal: No lower extremity tenderness nor edema.  No joint effusions. Neurologic:  Normal speech and language. No gross focal neurologic deficits are appreciated. Speech is normal. No gait instability. Skin:  Excoriated areas noted to the neck, chest wall, and abdomen. Unable to identify a new lesion, therefore diagnosis is difficult.; Negative for petechiae.  Psychiatric: Mood and affect are normal. Speech and behavior are normal.  ____________________________________________   LABS (all labs ordered are listed, but only abnormal results are displayed)  Labs Reviewed - No data to display ____________________________________________  EKG   ____________________________________________  RADIOLOGY   ____________________________________________   PROCEDURES  Procedure(s) performed: None ____________________________________________   INITIAL IMPRESSION / ASSESSMENT AND PLAN / ED COURSE  Pertinent labs & imaging results that were available during my care of the patient were reviewed by me and considered in my medical decision making (see chart for details).  Patient was advised that this is either a contact dermatitis from something that was on the couch or a mild allergic reaction to the shrimp that she had eaten the night prior to onset of the rash. She was advised to take the Valium, Pepcid, and redness around as prescribed. 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 She was advised to follow-up with the dermatologist for symptoms that are not improving over the next several  days. She was advised to return to the emergency department for symptoms that change or worsen if she is unable to schedule an appointment. ____________________________________________   FINAL CLINICAL IMPRESSION(S) / ED DIAGNOSES  Final diagnoses:  Rash and nonspecific skin eruption       Chinita Pester, FNP 02/23/15 2234  Sharman Cheek, MD 02/23/15 [phone]

## 2015-02-23 NOTE — ED Notes (Signed)
Pt c/o rash that started Saturday.  Has been itching.  Has tried benadryl OTC.  No fevers.

## 2015-02-23 NOTE — ED Notes (Signed)
Pt discharged to home.  Family member driving.  Discharge instructions reviewed.  Verbalized understanding.  No questions or concerns at this time.  Teach back verified.  Pt in NAD.  No items left in ED.   

## 2015-02-23 NOTE — Discharge Instructions (Signed)

## 2015-04-19 ENCOUNTER — Encounter: Payer: Self-pay | Admitting: Emergency Medicine

## 2015-04-19 ENCOUNTER — Emergency Department: Payer: BLUE CROSS/BLUE SHIELD

## 2015-04-19 ENCOUNTER — Emergency Department
Admission: EM | Admit: 2015-04-19 | Discharge: 2015-04-19 | Disposition: A | Discharge status: Home / Self Care | Payer: BLUE CROSS/BLUE SHIELD | Attending: Emergency Medicine | Admitting: Emergency Medicine

## 2015-04-19 DIAGNOSIS — Z792 Long term (current) use of antibiotics: Secondary | ICD-10-CM | POA: Diagnosis not present

## 2015-04-19 DIAGNOSIS — Z79899 Other long term (current) drug therapy: Secondary | ICD-10-CM | POA: Insufficient documentation

## 2015-04-19 DIAGNOSIS — Z791 Long term (current) use of non-steroidal anti-inflammatories (NSAID): Secondary | ICD-10-CM | POA: Insufficient documentation

## 2015-04-19 DIAGNOSIS — Z7951 Long term (current) use of inhaled steroids: Secondary | ICD-10-CM | POA: Insufficient documentation

## 2015-04-19 DIAGNOSIS — F419 Anxiety disorder, unspecified: Secondary | ICD-10-CM | POA: Diagnosis not present

## 2015-04-19 DIAGNOSIS — J4521 Mild intermittent asthma with (acute) exacerbation: Secondary | ICD-10-CM | POA: Diagnosis not present

## 2015-04-19 DIAGNOSIS — R0602 Shortness of breath: Secondary | ICD-10-CM | POA: Diagnosis present

## 2015-04-19 MED ORDER — ALBUTEROL SULFATE (2.5 MG/3ML) 0.083% IN NEBU
5.0000 mg | INHALATION_SOLUTION | Freq: Once | RESPIRATORY_TRACT | Status: AC
  Administered 2015-04-19: 5 mg via RESPIRATORY_TRACT
  Filled 2015-04-19: qty 6

## 2015-04-19 MED ORDER — CETIRIZINE HCL 10 MG PO TABS: 10.0000 mg | ORAL_TABLET | Freq: Every day | ORAL | Status: DC

## 2015-04-19 MED ORDER — ALBUTEROL SULFATE HFA 108 (90 BASE) MCG/ACT IN AERS: 2.0000 | INHALATION_SPRAY | RESPIRATORY_TRACT | Status: DC | PRN

## 2015-04-19 MED ORDER — FLUTICASONE PROPIONATE 50 MCG/ACT NA SUSP: 1.0000 | Freq: Two times a day (BID) | NASAL | Status: DC

## 2015-04-19 NOTE — ED Provider Notes (Signed)
[facility] Emergency Department Provider Note  ____________________________________________  Time seen: Approximately 2:02 PM  I have reviewed the triage vital signs and the nursing notes.   HISTORY  Chief Complaint Shortness of Breath    HPI Debbie Leon is a 55 y.o. female who presents emergency Department with complaint of an asthma attack. Patient states that she has intermittent asthma that is exacerbated by the change of seasons. Patient states that she began to feel short of breath at work and was told that she cannot leave. She states this increased her anxiety which increased her asthma. She presents to the emergency department complaining of chest tightness and wheezing.   Past Medical History  Diagnosis Date  . Asthma     There are no active problems to display for this patient.   History reviewed. No pertinent past surgical history.  Current Outpatient Rx  Name  Route  Sig  Dispense  Refill  . albuterol (PROVENTIL HFA;VENTOLIN HFA) 108 (90 Base) MCG/ACT inhaler   Inhalation   Inhale 2 puffs into the lungs every 4 (four) hours as needed for wheezing or shortness of breath.   1 Inhaler   0   . cetirizine (ZYRTEC) 10 MG tablet   Oral   Take 1 tablet (10 mg total) by mouth daily.   30 tablet   0   . ciprofloxacin (CIPRO) 500 MG tablet   Oral   Take 1 tablet (500 mg total) by mouth 2 (two) times daily.   14 tablet   0   . clindamycin (CLEOCIN) 300 MG capsule   Oral   Take 1 capsule (300 mg total) by mouth 4 (four) times daily.   40 capsule   0   . cyclobenzaprine (FLEXERIL) 10 MG tablet   Oral   Take 1 tablet (10 mg total) by mouth 3 (three) times daily as needed for muscle spasms.   30 tablet   0   . diazepam (VALIUM) 2 MG tablet   Oral   Take 1 tablet (2 mg total) by mouth every 8 (eight) hours as needed.   12 tablet   0   . famotidine (PEPCID) 20 MG tablet   Oral   Take 1 tablet (20 mg total) by mouth daily.    14 tablet   0   . fluticasone (FLONASE) 50 MCG/ACT nasal spray   Each Nare   Place 1 spray into both nostrils 2 (two) times daily.   16 g   0   . HYDROcodone-acetaminophen (NORCO) 5-325 MG tablet   Oral   Take 1 tablet by mouth every 8 (eight) hours as needed for moderate pain.   10 tablet   0   . meloxicam (MOBIC) 15 MG tablet   Oral   Take 1 tablet (15 mg total) by mouth daily.   30 tablet   2   . methocarbamol (ROBAXIN) 750 MG tablet   Oral   Take 1 tablet (750 mg total) by mouth 4 (four) times daily.   40 tablet   0     Stop the cyclobenzaprine.   . predniSONE (STERAPRED UNI-PAK 21 TAB) 10 MG (21) TBPK tablet      Take 6 tablets on day 1 Take 5 tablets on day 2 Take 4 tablets on day 3 Take 3 tablets on day 4 Take 2 tablets on day 5 Take 1 tablet on day 6   21 tablet   0   . traMADol (ULTRAM) 50 MG  tablet   Oral   Take 1 tablet (50 mg total) by mouth every 6 (six) hours as needed.   9 tablet   0     Allergies Dye fdc red and Shrimp  History reviewed. No pertinent family history.  Social History Social History  Substance Use Topics  . Smoking status: Never Smoker   . Smokeless tobacco: None  . Alcohol Use: No     Review of Systems  Constitutional: No fever/chills Eyes: No visual changes. No discharge ENT: No sore throat. Denies nasal congestion. Cardiovascular: no chest pain. Respiratory: no cough. Positive shortness of breath. Musculoskeletal: Negative for back pain. Skin: Negative for rash. Neurological: Negative for headaches, focal weakness or numbness. 10-point ROS otherwise negative.  ____________________________________________   PHYSICAL EXAM:  VITAL SIGNS: ED Triage Vitals  Enc Vitals Group     BP 04/19/15 1222 150/82 mmHg     Pulse Rate 04/19/15 1222 85     Resp 04/19/15 1222 22     Temp 04/19/15 1222 97.5 F (36.4 C)     Temp Source 04/19/15 1222 Oral     SpO2 04/19/15 1222 100 %     Weight 04/19/15 1222 190 lb  (86.183 kg)     Height 04/19/15 1222  (1.676 m)     Head Cir --      Peak Flow --      Pain Score 04/19/15 1226 9     Pain Loc --      Pain Edu? --      Excl. in GC? --      Constitutional: Alert and oriented. Well appearing and in no acute distress. Eyes: Conjunctivae are normal. PERRL. EOMI. Head: Atraumatic. ENT:      Ears:       Nose: No congestion/rhinnorhea.      Mouth/Throat: Mucous membranes are moist. Oropharynx is nonerythematous and nonedematous. Neck: No stridor.   Hematological/Lymphatic/Immunilogical: No cervical lymphadenopathy. Cardiovascular: Normal rate, regular rhythm. Normal S1 and S2.  Good peripheral circulation. Respiratory: Patient received albuterol nebulizer treatment prior to auscultation. At this time, Normal respiratory effort without tachypnea or retractions. Lungs CTAB. No wheezing, rales, rhonchi. No decreased or absent breath sounds. Gastrointestinal: Soft and nontender. No distention. No CVA tenderness. Musculoskeletal: No lower extremity tenderness nor edema.  No joint effusions. Neurologic:  Normal speech and language. No gross focal neurologic deficits are appreciated.  Skin:  Skin is warm, dry and intact. No rash noted. Psychiatric: Mood and affect are normal. Speech and behavior are normal. Patient exhibits appropriate insight and judgement.   ____________________________________________   LABS (all labs ordered are listed, but only abnormal results are displayed)  Labs Reviewed - No data to display ____________________________________________  EKG  EKG reveals normal sinus rhythm. No ST elevations or depressions noted. P wave inversion in V1 significant for possible left atrial enlargement. PR, QRS, QT intervals are within normal limits. No delta waves or Q waves are present. ____________________________________________  RADIOLOGY Festus Barren Cuthriell, personally viewed and evaluated these images (plain radiographs) as part of  my medical decision making, as well as reviewing the written report by the radiologist.  Dg Chest 2 View  04/19/2015  CLINICAL DATA:  Shortness of breath today. Cough. Initial encounter. EXAM: CHEST  2 VIEW COMPARISON:  PA and lateral chest 03/23/2013 and 10/10/2010. FINDINGS: Lung volumes are somewhat low but the lungs are clear. Heart size is normal. No pneumothorax or pleural effusion. No focal bony abnormality. Mild thoracic spondylosis noted. IMPRESSION:  No acute disease. Electronically Signed   By: Drusilla Kanner M.D.   On: 04/19/2015 13:22    ____________________________________________    PROCEDURES  Procedure(s) performed:       Medications  albuterol (PROVENTIL) (2.5 MG/3ML) 0.083% nebulizer solution 5 mg (5 mg Nebulization Given 04/19/15 1237)     ____________________________________________   INITIAL IMPRESSION / ASSESSMENT AND PLAN / ED COURSE  Pertinent labs & imaging results that were available during my care of the patient were reviewed by me and considered in my medical decision making (see chart for details).  Patient's diagnosis is consistent with asthma exacerbation. Patient has a history of asthma that is exacerbated by changing seasons/temperatures as well as seasonal allergies. Patient presented with tachypnea and wheezing and was given albuterol prior to assessment by provider. Upon initial assessment patient reports great improvement with no residual symptoms. She denies any tachypnea or shortness of breath at this time. Exam was reassuring with no adventitious breath sounds. Chest x-ray was unremarkable. At this time there is no indication for further breathing treatments or steroids. Patient is currently out of her prescribed albuterol inhaler which will be refilled the patient will be placed on allergy medications to reduce symptoms..  Patient is given ED precautions to return to the ED for any worsening or new  symptoms.     ____________________________________________  FINAL CLINICAL IMPRESSION(S) / ED DIAGNOSES  Final diagnoses:  Asthma, mild intermittent, with acute exacerbation      NEW MEDICATIONS STARTED DURING THIS VISIT:  New Prescriptions   ALBUTEROL (PROVENTIL HFA;VENTOLIN HFA) 108 (90 BASE) MCG/ACT INHALER    Inhale 2 puffs into the lungs every 4 (four) hours as needed for wheezing or shortness of breath.   CETIRIZINE (ZYRTEC) 10 MG TABLET    Take 1 tablet (10 mg total) by mouth daily.   FLUTICASONE (FLONASE) 50 MCG/ACT NASAL SPRAY    Place 1 spray into both nostrils 2 (two) times daily.        Delorise Royals Cuthriell, PA-C 04/19/15 1419  Sharyn Creamer, MD 04/21/15 [phone]

## 2015-04-19 NOTE — ED Notes (Signed)
See triage   States she developed some wheezing and SOB this am. Was given SVn in triage and feels much better lungs sounds improved. States she is out of her inhaler

## 2015-04-19 NOTE — Discharge Instructions (Signed)
Asthma Attack Prevention °While you may not be able to control the fact that you have asthma, you can take actions to prevent asthma attacks. The best way to prevent asthma attacks is to maintain good control of your asthma. You can achieve this by: °· Taking your medicines as directed. °· Avoiding things that can irritate your airways or make your asthma symptoms worse (asthma triggers). °· Keeping track of how well your asthma is controlled and of any changes in your symptoms. °· Responding quickly to worsening asthma symptoms (asthma attack). °· Seeking emergency care when it is needed. °WHAT ARE SOME WAYS TO PREVENT AN ASTHMA ATTACK? °Have a Plan °Work with your health care provider to create a written plan for managing and treating your asthma attacks (asthma action plan). This plan includes: °· A list of your asthma triggers and how you can avoid them. °· Information on when medicines should be taken and when their dosages should be changed. °· The use of a device that measures how well your lungs are working (peak flow meter). °Monitor Your Asthma °Use your peak flow meter and record your results in a journal every day. A drop in your peak flow numbers on one or more days may indicate the start of an asthma attack. This can happen even before you start to feel symptoms. You can prevent an asthma attack from getting worse by following the steps in your asthma action plan. °Avoid Asthma Triggers °Work with your asthma health care provider to find out what your asthma triggers are. This can be done by: °· Allergy testing. °· Keeping a journal that notes when asthma attacks occur and the factors that may have contributed to them. °· Determining if there are other medical conditions that are making your asthma worse. °Once you have determined your asthma triggers, take steps to avoid them. This may include avoiding excessive or prolonged exposure to: °· Dust. Have someone dust and vacuum your home for you once or  twice a week. Using a high-efficiency particulate arrestance (HEPA) vacuum is best. °· Smoke. This includes campfire smoke, forest fire smoke, and secondhand smoke from tobacco products. °· Pet dander. Avoid contact with animals that you know you are allergic to. °· Allergens from trees, grasses or pollens. Avoid spending a lot of time outdoors when pollen counts are high, and on very windy days. °· Very cold, dry, or humid air. °· Mold. °· Foods that contain high amounts of sulfites. °· Strong odors. °· Outdoor air pollutants, such as engine exhaust. °· Indoor air pollutants, such as aerosol sprays and fumes from household cleaners. °· Household pests, including dust mites and cockroaches, and pest droppings. °· Certain medicines, including NSAIDs. Always talk to your health care provider before stopping or starting any new medicines. °Medicines °Take over-the-counter and prescription medicines only as told by your health care provider. Many asthma attacks can be prevented by carefully following your medicine schedule. Taking your medicines correctly is especially important when you cannot avoid certain asthma triggers. °Act Quickly °If an asthma attack does happen, acting quickly can decrease how severe it is and how long it lasts. Take these steps:  °· Pay attention to your symptoms. If you are coughing, wheezing, or having difficulty breathing, do not wait to see if your symptoms go away on their own. Follow your asthma action plan. °· If you have followed your asthma action plan and your symptoms are not improving, call your health care provider or seek immediate medical care   at the nearest hospital. °It is important to note how often you need to use your fast-acting rescue inhaler. If you are using your rescue inhaler more often, it may mean that your asthma is not under control. Adjusting your asthma treatment plan may help you to prevent future asthma attacks and help you to gain better control of your  condition. °HOW CAN I PREVENT AN ASTHMA ATTACK WHEN I EXERCISE? °Follow advice from your health care provider about whether you should use your fast-acting inhaler before exercising. Many people with asthma experience exercise-induced bronchoconstriction (EIB). This condition often worsens during vigorous exercise in cold, humid, or dry environments. Usually, people with EIB can stay very active by pre-treating with a fast-acting inhaler before exercising. °  °This information is not intended to replace advice given to you by your health care provider. Make sure you discuss any questions you have with your health care provider. °  °Document Released: 01/23/2009 Document Revised: 10/26/2014 Document Reviewed: 07/07/2014 °Elsevier Interactive Patient Education ©2016 [employer] ° °Asthma, Adult °Asthma is a recurring condition in which the airways tighten and narrow. Asthma can make it difficult to breathe. It can cause coughing, wheezing, and shortness of breath. Asthma episodes, also called asthma attacks, range from minor to life-threatening. Asthma cannot be cured, but medicines and lifestyle changes can help control it. °CAUSES °Asthma is believed to be caused by inherited (genetic) and environmental factors, but its exact cause is unknown. Asthma may be triggered by allergens, lung infections, or irritants in the air. Asthma triggers are different for each person. Common triggers include:  °· Animal dander. °· Dust mites. °· Cockroaches. °· Pollen from trees or grass. °· Mold. °· Smoke. °· Air pollutants such as dust, household cleaners, hair sprays, aerosol sprays, paint fumes, strong chemicals, or strong odors. °· Cold air, weather changes, and winds (which increase molds and pollens in the air). °· Strong emotional expressions such as crying or laughing hard. °· Stress. °· Certain medicines (such as aspirin) or types of drugs (such as beta-blockers). °· Sulfites in foods and drinks. Foods and drinks that  may contain sulfites include dried fruit, potato chips, and sparkling grape juice. °· Infections or inflammatory conditions such as the flu, a cold, or an inflammation of the nasal membranes (rhinitis). °· Gastroesophageal reflux disease (GERD). °· Exercise or strenuous activity. °SYMPTOMS °Symptoms may occur immediately after asthma is triggered or many hours later. Symptoms include: °· Wheezing. °· Excessive nighttime or early morning coughing. °· Frequent or severe coughing with a common cold. °· Chest tightness. °· Shortness of breath. °DIAGNOSIS  °The diagnosis of asthma is made by a review of your medical history and a physical exam. Tests may also be performed. These may include: °· Lung function studies. These tests show how much air you breathe in and out. °· Allergy tests. °· Imaging tests such as X-rays. °TREATMENT  °Asthma cannot be cured, but it can usually be controlled. Treatment involves identifying and avoiding your asthma triggers. It also involves medicines. There are 2 classes of medicine used for asthma treatment:  °· Controller medicines. These prevent asthma symptoms from occurring. They are usually taken every day. °· Reliever or rescue medicines. These quickly relieve asthma symptoms. They are used as needed and provide short-term relief. °Your health care provider will help you create an asthma action plan. An asthma action plan is a written plan for managing and treating your asthma attacks. It includes a list of your asthma triggers and how they   may be avoided. It also includes information on when medicines should be taken and when their dosage should be changed. An action plan may also involve the use of a device called a peak flow meter. A peak flow meter measures how well the lungs are working. It helps you monitor your condition. °HOME CARE INSTRUCTIONS  °· Take medicines only as directed by your health care provider. Speak with your health care provider if you have questions about  how or when to take the medicines. °· Use a peak flow meter as directed by your health care provider. Record and keep track of readings. °· Understand and use the action plan to help minimize or stop an asthma attack without needing to seek medical care. °· Control your home environment in the following ways to help prevent asthma attacks: °¨ Do not smoke. Avoid being exposed to secondhand smoke. °¨ Change your heating and air conditioning filter regularly. °¨ Limit your use of fireplaces and wood stoves. °¨ Get rid of pests (such as roaches and mice) and their droppings. °¨ Throw away plants if you see mold on them. °¨ Clean your floors and dust regularly. Use unscented cleaning products. °¨ Try to have someone else vacuum for you regularly. Stay out of rooms while they are being vacuumed and for a short while afterward. If you vacuum, use a dust mask from a hardware store, a double-layered or microfilter vacuum cleaner bag, or a vacuum cleaner with a HEPA filter. °¨ Replace carpet with wood, tile, or vinyl flooring. Carpet can trap dander and dust. °¨ Use allergy-proof pillows, mattress covers, and box spring covers. °¨ Wash bed sheets and blankets every week in hot water and dry them in a dryer. °¨ Use blankets that are made of polyester or cotton. °¨ Clean bathrooms and kitchens with bleach. If possible, have someone repaint the walls in these rooms with mold-resistant paint. Keep out of the rooms that are being cleaned and painted. °¨ Wash hands frequently. °SEEK MEDICAL CARE IF:  °· You have wheezing, shortness of breath, or a cough even if taking medicine to prevent attacks. °· The colored mucus you cough up (sputum) is thicker than usual. °· Your sputum changes from clear or white to yellow, green, gray, or bloody. °· You have any problems that may be related to the medicines you are taking (such as a rash, itching, swelling, or trouble breathing). °· You are using a reliever medicine more than 2-3 times per  week. °· Your peak flow is still at 50-79% of your personal best after following your action plan for 1 hour. °· You have a fever. °SEEK IMMEDIATE MEDICAL CARE IF:  °· You seem to be getting worse and are unresponsive to treatment during an asthma attack. °· You are short of breath even at rest. °· You get short of breath when doing very little physical activity. °· You have difficulty eating, drinking, or talking due to asthma symptoms. °· You develop chest pain. °· You develop a fast heartbeat. °· You have a bluish color to your lips or fingernails. °· You are light-headed, dizzy, or faint. °· Your peak flow is less than 50% of your personal best. °  °This information is not intended to replace advice given to you by your health care provider. Make sure you discuss any questions you have with your health care provider. °  °Document Released: 02/04/2005 Document Revised: 10/26/2014 Document Reviewed: 09/03/2012 °Elsevier Interactive Patient Education ©2016 [employer] ° °

## 2015-04-19 NOTE — ED Notes (Addendum)
Pt to ed with c/o sob today,  Pt states she had an asthma attack at work and she was unable to leave,  Pt reports she became upset but it much calmer now.  Pt reports mild sob at this time.  Mild cough noted.  Pt in NO resp distress.  Able to speak in full complete sentences.   Pt states she feels like there is mucus in her chest, denies chest pain.

## 2015-05-03 ENCOUNTER — Encounter: Payer: Self-pay | Admitting: Medical Oncology

## 2015-05-03 ENCOUNTER — Emergency Department
Admission: EM | Admit: 2015-05-03 | Discharge: 2015-05-03 | Disposition: A | Discharge status: Home / Self Care | Payer: BLUE CROSS/BLUE SHIELD | Attending: Emergency Medicine | Admitting: Emergency Medicine

## 2015-05-03 DIAGNOSIS — J45909 Unspecified asthma, uncomplicated: Secondary | ICD-10-CM | POA: Diagnosis not present

## 2015-05-03 DIAGNOSIS — K122 Cellulitis and abscess of mouth: Secondary | ICD-10-CM | POA: Insufficient documentation

## 2015-05-03 DIAGNOSIS — Z792 Long term (current) use of antibiotics: Secondary | ICD-10-CM | POA: Insufficient documentation

## 2015-05-03 DIAGNOSIS — H9201 Otalgia, right ear: Secondary | ICD-10-CM | POA: Insufficient documentation

## 2015-05-03 DIAGNOSIS — K1121 Acute sialoadenitis: Secondary | ICD-10-CM | POA: Diagnosis not present

## 2015-05-03 DIAGNOSIS — K1120 Sialoadenitis, unspecified: Secondary | ICD-10-CM

## 2015-05-03 DIAGNOSIS — K0889 Other specified disorders of teeth and supporting structures: Secondary | ICD-10-CM | POA: Diagnosis present

## 2015-05-03 DIAGNOSIS — Z79899 Other long term (current) drug therapy: Secondary | ICD-10-CM | POA: Diagnosis not present

## 2015-05-03 MED ORDER — FLUTICASONE PROPIONATE 50 MCG/ACT NA SUSP: 2.0000 | Freq: Every day | NASAL | Status: DC

## 2015-05-03 MED ORDER — AMOXICILLIN-POT CLAVULANATE 875-125 MG PO TABS: 1.0000 | ORAL_TABLET | Freq: Two times a day (BID) | ORAL | Status: AC

## 2015-05-03 NOTE — ED Notes (Signed)
Pt c/o rt sided dental pain/ear pain x 4 months.

## 2015-05-03 NOTE — ED Provider Notes (Signed)
[facility] Emergency Department Provider Note  ____________________________________________  Time seen: Approximately 10:34 AM  I have reviewed the triage vital signs and the nursing notes.   HISTORY  Chief Complaint Dental Pain and Otalgia    HPI Debbie Leon is a 55 y.o. female , NAD, presents to the ED with 1 year history of right sided jaw and ear pain. Has seen a dentist and air nose and throat specialist over the last few months and neither found any cause to her pain and symptoms. Has had negative dental xrays.  States she has been to this emergency department numerous times and diagnosed with ear infection, given antibiotics which do not believe the symptoms. Notes it does make it better but it usually comes back. Notes she hears fluid in the right ear. Has swelling about the right side of her jaw and inside of her cheek. Denies any injury or trauma to the area. Feels like she has infection in the gum line. His not had any fever, chills, body aches. Is taking ibuprofen with minimal relief.   Past Medical History  Diagnosis Date  . Asthma     There are no active problems to display for this patient.   History reviewed. No pertinent past surgical history.  Current Outpatient Rx  Name  Route  Sig  Dispense  Refill  . albuterol (PROVENTIL HFA;VENTOLIN HFA) 108 (90 Base) MCG/ACT inhaler   Inhalation   Inhale 2 puffs into the lungs every 4 (four) hours as needed for wheezing or shortness of breath.   1 Inhaler   0   . amoxicillin-clavulanate (AUGMENTIN) 875-125 MG tablet   Oral   Take 1 tablet by mouth 2 (two) times daily.   14 tablet   0   . cetirizine (ZYRTEC) 10 MG tablet   Oral   Take 1 tablet (10 mg total) by mouth daily.   30 tablet   0   . ciprofloxacin (CIPRO) 500 MG tablet   Oral   Take 1 tablet (500 mg total) by mouth 2 (two) times daily.   14 tablet   0   . clindamycin (CLEOCIN) 300 MG capsule   Oral   Take 1 capsule  (300 mg total) by mouth 4 (four) times daily.   40 capsule   0   . cyclobenzaprine (FLEXERIL) 10 MG tablet   Oral   Take 1 tablet (10 mg total) by mouth 3 (three) times daily as needed for muscle spasms.   30 tablet   0   . diazepam (VALIUM) 2 MG tablet   Oral   Take 1 tablet (2 mg total) by mouth every 8 (eight) hours as needed.   12 tablet   0   . famotidine (PEPCID) 20 MG tablet   Oral   Take 1 tablet (20 mg total) by mouth daily.   14 tablet   0   . fluticasone (FLONASE) 50 MCG/ACT nasal spray   Each Nare   Place 2 sprays into both nostrils daily.   16 g   0   . methocarbamol (ROBAXIN) 750 MG tablet   Oral   Take 1 tablet (750 mg total) by mouth 4 (four) times daily.   40 tablet   0     Stop the cyclobenzaprine.     Allergies Dye fdc red and Shrimp  No family history on file.  Social History Social History  Substance Use Topics  . Smoking status: Never Smoker   . Smokeless  tobacco: None  . Alcohol Use: No     Review of Systems  Constitutional: No fever/chills, fatigue Eyes: No visual changes. No discharge ENT: Positive right ear pain, right-sided mouth pain. No sore throat, nasal congestion, runny nose, sinus pressure. Cardiovascular: No chest pain. Respiratory: No cough. No shortness of breath. No wheezing.  Gastrointestinal: No abdominal pain.  No nausea, vomiting.  Musculoskeletal: Negative for neck pain.  Skin: Negative for rash. Neurological: Negative for headaches, focal weakness or numbness. 10-point ROS otherwise negative.  ____________________________________________   PHYSICAL EXAM:  VITAL SIGNS: ED Triage Vitals  Enc Vitals Group     BP 05/03/15 0957 149/82 mmHg     Pulse Rate 05/03/15 0957 71     Resp 05/03/15 0957 18     Temp --      Temp src --      SpO2 05/03/15 0957 100 %     Weight 05/03/15 0957 190 lb (86.183 kg)     Height --      Head Cir --      Peak Flow --      Pain Score 05/03/15 0957 10     Pain Loc --       Pain Edu? --      Excl. in GC? --     Constitutional: Alert and oriented. Well appearing and in no acute distress. Eyes: Conjunctivae are normal.  Head: Atraumatic. ENT:      Ears: Right TM visualized with mild serous effusion but no erythema, bulging, perforation.      Nose: No congestion/rhinnorhea.      Mouth/Throat: Mild swelling noted to the right cheek with some redness along the right lower gumline. No molars about the right lower gumline. Mucous membranes are moist. Without erythema, swelling, exudate. Neck: No cervical spine tenderness to palpation. Supple with full range of motion. Hematological/Lymphatic/Immunilogical: No cervical lymphadenopathy. Cardiovascular: Good peripheral circulation. Respiratory: Normal respiratory effort without tachypnea or retractions.  Musculoskeletal: Mild swelling about the right lower jawline with inflamed salivary gland to palpation. No TMJ pain to palpation. No popping of locking of the TMJ with talking. Neurologic:  Normal speech and language. No gross focal neurologic deficits are appreciated.  Skin:  Skin is warm, dry and intact. No rash noted. Psychiatric: Mood and affect are normal. Speech and behavior are normal. Patient exhibits appropriate insight and judgement.   ____________________________________________   LABS  None  ____________________________________________  EKG  None ____________________________________________  RADIOLOGY  None  ____________________________________________    PROCEDURES  Procedure(s) performed: None    Medications - No data to display   ____________________________________________   INITIAL IMPRESSION / ASSESSMENT AND PLAN / ED COURSE  Patient's diagnosis is consistent with cellulitis oral soft tissues and sialoadenitis. Patient will be discharged home with prescriptions for Augmentin and Flonase to use as directed. Patient is to follow up with her primary care physician to  continue workup and treatment if symptoms persist or recur. Patient is given ED precautions to return to the ED for any worsening or new symptoms.    ____________________________________________  FINAL CLINICAL IMPRESSION(S) / ED DIAGNOSES  Final diagnoses:  Cellulitis of oral soft tissues  Sialoadenitis of submandibular gland      NEW MEDICATIONS STARTED DURING THIS VISIT:  New Prescriptions   AMOXICILLIN-CLAVULANATE (AUGMENTIN) 875-125 MG TABLET    Take 1 tablet by mouth 2 (two) times daily.   FLUTICASONE (FLONASE) 50 MCG/ACT NASAL SPRAY    Place 2 sprays into both nostrils daily.  Hope Pigeon, PA-C 05/03/15 1046  Jennye Moccasin, MD 05/03/15 1315

## 2015-05-03 NOTE — Discharge Instructions (Signed)
Salivary Gland Infection A salivary gland infection is an infection in one or more of the glands that produce spit (saliva). You have six major salivary glands. Each gland has a duct that carries saliva into your mouth. Saliva keeps your mouth moist and breaks down the food that you eat. It also helps to prevent tooth decay. Two salivary glands are located just in front of your ears (parotid). The ducts for these glands open up inside your cheeks, near your back teeth. You also have two glands under your tongue (sublingual) and two glands under your jaw (submandibular). The ducts for these glands open under your tongue. Any salivary gland can become infected. Most infections occur in the parotid glands or submandibular glands. CAUSES Salivary glands can be infected by viruses or bacteria.  The mumps virus is the most common cause of viral salivary gland infections, though mumps is now rare in many areas because of vaccination.  This infection causes swelling in both parotid glands.  Viral infections are more common in children.  The bacteria that cause salivary gland infections are usually the same bacteria that normally live in your mouth.  A stone can form in a salivary gland and block the flow of saliva. As a result, saliva backs up into the salivary gland. Bacteria may then start to grow behind the blockage and cause infection.  Bacterial infections usually cause pain and swelling on one side of the face. Submandibular gland swelling occurs under the jaw. Parotid swelling occurs in front of the ear.  Bacterial infections are more common in adults. RISK FACTORS Children who do not get the MMR (measles, mumps, rubella) vaccine are more likely to get mumps, which can cause a viral salivary gland infection. Risk factors for bacterial infections include:  Poor dental care (oral hygiene).  Smoking.  Not drinking enough water.  Having a disease that causes dry mouth and dry eyes (Mikulicz  syndrome or Sjogren syndrome). SIGNS AND SYMPTOMS The main sign of salivary gland infection is a swollen salivary gland. This type of inflammation is often called sialadenitis. You may have swelling in front of your ear, under your jaw, or under your tongue. Swelling may get worse when you eat and decrease after you eat. Other signs and symptoms include:  Pain.  Tenderness.  Redness.  Dry mouth.  Bad taste in your mouth.  Difficulty chewing and swallowing.  Fever. DIAGNOSIS Your health care provider may suspect a salivary gland infection based on your signs and symptoms. He or she will also do a physical exam. The health care provider will look and feel inside your mouth to see whether a stone is blocking a salivary gland duct. You may need to see an ear, nose, and throat specialist (ENT or otolaryngologist) for diagnosis and treatment. You may also need to have diagnostic tests, such as:  An X-ray to check for a stone.  Other imaging studies to look for an abscess and to rule out other causes of swelling. These tests may include:  Ultrasound.  CT scan.  MRI.  Culture and sensitivity test. This involves collecting a sample of pus for testing in the lab to see what bacteria grow and what antibiotics they are sensitive to. The testing sample may be:  Swabbed from a salivary gland duct.  Withdrawn from a swollen gland with a needle (aspiration). TREATMENT Viral salivary gland infections usually clear up without treatment. Bacterial infections are usually treated with antibiotic medicine. Severe infections that cause difficulty with swallowing  be treated with an IV antibiotic in the hospital. °Other treatments may include: °· Probing and widening the salivary duct to allow a stone to pass. In some cases, a thin, flexible scope (endoscope) may be inserted into the duct to find a stone and remove it. °· Breaking up a stone using sound waves. °· Draining an infected gland (abscess)  with a needle. °· In some cases, you may need surgery so your health care provider can: °¨ Remove a stone. °¨ Drain pus from an abscess. °¨ Remove a badly infected gland. °HOME CARE INSTRUCTIONS °· Take medicines only as directed by your health care provider. °· If you were prescribed an antibiotic medicine, finish it all even if you start to feel better. °· Follow these instructions every few hours: °¨ Suck on a lemon candy to stimulate the flow of saliva. °¨ Put a warm compress over the gland. °¨ Gently massage the gland. °· Drink enough fluid to keep your urine clear or pale yellow. °· Rinse your mouth with a mixture of warm water and salt every few hours. To make this mixture, add a pinch of salt to 1 cup of warm water. °· Practice good oral hygiene by brushing and flossing your teeth after meals and before you go to bed. °· Do not use any tobacco products, including cigarettes, chewing tobacco, or electronic cigarettes. If you need help quitting, ask your health care provider. °SEEK MEDICAL CARE IF: °· You have pain and swelling in your face, jaw, or mouth after eating. °· You have persistent swelling in any of these places: °¨ In front of your ear. °¨ Under your jaw. °¨ Inside your mouth. °SEEK IMMEDIATE MEDICAL CARE IF:  °· You have pain and swelling in your face, jaw, or mouth that are getting worse. °· Your pain and swelling make it hard to swallow or breathe. °  °This information is not intended to replace advice given to you by your health care provider. Make sure you discuss any questions you have with your health care provider. °  °Document Released: 03/14/2004 Document Revised: 02/25/2014 Document Reviewed: 07/07/2013 °Elsevier Interactive Patient Education ©2016 Else[employer] Inc[employer] ° ° °

## 2015-05-03 NOTE — ED Notes (Signed)
Pt presents with right side facial pain and swelling for one year. Has been seen at dentist and has been negative for dental issues. Also reports right ear pain and has been taking IBU at home with no relief. Mild swelling noted to right side face. No resp distress noted.

## 2015-07-09 ENCOUNTER — Emergency Department
Admission: EM | Admit: 2015-07-09 | Discharge: 2015-07-09 | Disposition: A | Discharge status: Home / Self Care | Payer: BLUE CROSS/BLUE SHIELD | Attending: Emergency Medicine | Admitting: Emergency Medicine

## 2015-07-09 ENCOUNTER — Encounter: Payer: Self-pay | Admitting: Emergency Medicine

## 2015-07-09 DIAGNOSIS — Z7951 Long term (current) use of inhaled steroids: Secondary | ICD-10-CM | POA: Diagnosis not present

## 2015-07-09 DIAGNOSIS — Z792 Long term (current) use of antibiotics: Secondary | ICD-10-CM | POA: Diagnosis not present

## 2015-07-09 DIAGNOSIS — J45909 Unspecified asthma, uncomplicated: Secondary | ICD-10-CM | POA: Diagnosis not present

## 2015-07-09 DIAGNOSIS — M79662 Pain in left lower leg: Secondary | ICD-10-CM | POA: Diagnosis present

## 2015-07-09 DIAGNOSIS — R252 Cramp and spasm: Secondary | ICD-10-CM | POA: Insufficient documentation

## 2015-07-09 DIAGNOSIS — Z79899 Other long term (current) drug therapy: Secondary | ICD-10-CM | POA: Diagnosis not present

## 2015-07-09 NOTE — ED Notes (Signed)
PA aware of patient complaints of asthma attack, requesting a breathing tx, and chest tightness. PA aware that patient is coughing but is able to speak in complete sentences.

## 2015-07-09 NOTE — ED Notes (Signed)
This RN to bedside. Pt c/o asthma attack, pt able to speak in complete sentences at this time, however this RN saw patient with rescue inhaler, states she has had 3 puffs, pt also c/o chest tightness at this time.

## 2015-07-09 NOTE — ED Provider Notes (Signed)
[facility] Emergency Department Provider Note  ____________________________________________  Time seen: Approximately 8:47 PM  I have reviewed the triage vital signs and the nursing notes.   HISTORY  Chief Complaint Leg Pain    HPI Debbie Leon is a 55 y.o. female who reports an episode of leg cramping while in the bed earlier today. Mostly the left calf. She had to abruptly stand to relieve the cramping. Symptoms lasted about 6 minutes. Currently she is asymptomatic. However she does have a mild headache. She reports being very busy the last 3 days. Watched her daughters graduation out in the sun for several hours 2 days ago.   Past Medical History  Diagnosis Date  . Asthma     There are no active problems to display for this patient.   History reviewed. No pertinent past surgical history.  Current Outpatient Rx  Name  Route  Sig  Dispense  Refill  . albuterol (PROVENTIL HFA;VENTOLIN HFA) 108 (90 Base) MCG/ACT inhaler   Inhalation   Inhale 2 puffs into the lungs every 4 (four) hours as needed for wheezing or shortness of breath.   1 Inhaler   0   . cetirizine (ZYRTEC) 10 MG tablet   Oral   Take 1 tablet (10 mg total) by mouth daily.   30 tablet   0   . ciprofloxacin (CIPRO) 500 MG tablet   Oral   Take 1 tablet (500 mg total) by mouth 2 (two) times daily.   14 tablet   0   . clindamycin (CLEOCIN) 300 MG capsule   Oral   Take 1 capsule (300 mg total) by mouth 4 (four) times daily.   40 capsule   0   . cyclobenzaprine (FLEXERIL) 10 MG tablet   Oral   Take 1 tablet (10 mg total) by mouth 3 (three) times daily as needed for muscle spasms.   30 tablet   0   . diazepam (VALIUM) 2 MG tablet   Oral   Take 1 tablet (2 mg total) by mouth every 8 (eight) hours as needed.   12 tablet   0   . famotidine (PEPCID) 20 MG tablet   Oral   Take 1 tablet (20 mg total) by mouth daily.   14 tablet   0   . fluticasone (FLONASE) 50 MCG/ACT  nasal spray   Each Nare   Place 2 sprays into both nostrils daily.   16 g   0   . methocarbamol (ROBAXIN) 750 MG tablet   Oral   Take 1 tablet (750 mg total) by mouth 4 (four) times daily.   40 tablet   0     Stop the cyclobenzaprine.     Allergies Dye fdc red and Shrimp  No family history on file.  Social History Social History  Substance Use Topics  . Smoking status: Never Smoker   . Smokeless tobacco: None  . Alcohol Use: No    Review of Systems Constitutional: No fever/chills Eyes: No visual changes. ENT: No sore throat. Cardiovascular: Denies chest pain. Respiratory: Denies shortness of breath. Gastrointestinal: No abdominal pain.  No nausea, no vomiting.  No diarrhea.  No constipation. Genitourinary: Negative for dysuria. Musculoskeletal: Negative for back pain. Skin: Negative for rash. Neurological: Negative for headaches, focal weakness or numbness. 10-point ROS otherwise negative.  ____________________________________________   PHYSICAL EXAM:  VITAL SIGNS: ED Triage Vitals  Enc Vitals Group     BP 07/09/15 2011 141/78 mmHg  Pulse Rate 07/09/15 2011 71     Resp 07/09/15 2011 18     Temp 07/09/15 2011 97.7 F (36.5 C)     Temp Source 07/09/15 2011 Oral     SpO2 07/09/15 2011 99 %     Weight 07/09/15 2011 180 lb (81.647 kg)     Height 07/09/15 2011  (1.651 m)     Head Cir --      Peak Flow --      Pain Score 07/09/15 2012 8     Pain Loc --      Pain Edu? --      Excl. in GC? --     Constitutional: Alert and oriented. Well appearing and in no acute distress. Eyes: Conjunctivae are normal.  EOMI. Mouth/Throat: Mucous membranes are moist. Neck:  Supple.  No adenopathy.   Cardiovascular: Normal rate, regular rhythm. Grossly normal heart sounds.  Good peripheral circulation. Respiratory: Normal respiratory effort.  No retractions. Lungs CTAB. Musculoskeletal: Nml ROM of upper and lower extremity joints.  Nontender in the left calf  currently. No edema. Neurologic:  Normal speech and language. No gross focal neurologic deficits are appreciated. No gait instability. Skin:  Skin is warm, dry and intact. No rash noted. Psychiatric: Mood and affect are normal. Speech and behavior are normal.  ____________________________________________   LABS (all labs ordered are listed, but only abnormal results are displayed)  Labs Reviewed - No data to display ____________________________________________  EKG   ____________________________________________  RADIOLOGY   ____________________________________________   PROCEDURES  Procedure(s) performed: None  Critical Care performed: No  ____________________________________________   INITIAL IMPRESSION / ASSESSMENT AND PLAN / ED COURSE  Pertinent labs & imaging results that were available during my care of the patient were reviewed by me and considered in my medical decision making (see chart for details).  55 year old who had an episode of muscle cramping, left calf primarily prior to arrival. Symptoms now resolved. She has been more active lately, watching her daughter's graduation out in the sun, 2 days ago. Also busy day at church today. Suspect mild dehydration. Offered labs but patient declines. Encouraged Gatorade or Powerade. She can also add a little salt to her diet.  Can follow-up with her primary physician if not improving.  The patient was getting discharged she began to have "an asthma attack", and took her own Ventolin inhaler. Her symptoms resolved within 20 minutes. I had seen the patient approximately 15 minutes prior and she mentioned no symptoms of chest tightness, shortness of breath. I think it is fine for her to be discharged with above plan. ____________________________________________   FINAL CLINICAL IMPRESSION(S) / ED DIAGNOSES  Final diagnoses:  Muscle cramps      Ignacia Bayley, PA-C 07/09/15 2138  Emily Filbert, MD 07/09/15  2325

## 2015-07-09 NOTE — Discharge Instructions (Signed)
Muscle Cramps and Spasms Muscle cramps and spasms occur when a muscle or muscles tighten and you have no control over this tightening (involuntary muscle contraction). They are a common problem and can develop in any muscle. The most common place is in the calf muscles of the leg. Both muscle cramps and muscle spasms are involuntary muscle contractions, but they also have differences:   Muscle cramps are sporadic and painful. They may last a few seconds to a quarter of an hour. Muscle cramps are often more forceful and last longer than muscle spasms.  Muscle spasms may or may not be painful. They may also last just a few seconds or much longer. CAUSES  It is uncommon for cramps or spasms to be due to a serious underlying problem. In many cases, the cause of cramps or spasms is unknown. Some common causes are:   Overexertion.   Overuse from repetitive motions (doing the same thing over and over).   Remaining in a certain position for a long period of time.   Improper preparation, form, or technique while performing a sport or activity.   Dehydration.   Injury.   Side effects of some medicines.   Abnormally low levels of the salts and ions in your blood (electrolytes), especially potassium and calcium. This could happen if you are taking water pills (diuretics) or you are pregnant.  Some underlying medical problems can make it more likely to develop cramps or spasms. These include, but are not limited to:   Diabetes.   Parkinson disease.   Hormone disorders, such as thyroid problems.   Alcohol abuse.   Diseases specific to muscles, joints, and bones.   Blood vessel disease where not enough blood is getting to the muscles.  HOME CARE INSTRUCTIONS   Stay well hydrated. Drink enough water and fluids to keep your urine clear or pale yellow.  It may be helpful to massage, stretch, and relax the affected muscle.  For tight or tense muscles, use a warm towel, heating  pad, or hot shower water directed to the affected area.  If you are sore or have pain after a cramp or spasm, applying ice to the affected area may relieve discomfort.  Put ice in a plastic bag.  Place a towel between your skin and the bag.  Leave the ice on for 15-20 minutes, 03-04 times a day.  Medicines used to treat a known cause of cramps or spasms may help reduce their frequency or severity. Only take over-the-counter or prescription medicines as directed by your caregiver. SEEK MEDICAL CARE IF:  Your cramps or spasms get more severe, more frequent, or do not improve over time.  MAKE SURE YOU:   Understand these instructions.  Will watch your condition.  Will get help right away if you are not doing well or get worse.   This information is not intended to replace advice given to you by your health care provider. Make sure you discuss any questions you have with your health care provider.   Document Released: 07/27/2001 Document Revised: 06/01/2012 Document Reviewed: 01/22/2012 Elsevier Interactive Patient Education 2016 [employer]   Try to stay well hydrated tonight. Can try Gatorade or Powerade. May also have a little salt to diet. Follow-up with your primary physician if cramping persists.

## 2015-07-09 NOTE — ED Notes (Signed)
NAD noted at time of D/C. Pt denies questions or concerns. Pt taken to the lobby at this time via wheelchair.   

## 2015-07-09 NOTE — ED Notes (Signed)
Patient states that she developed left lower leg cramping and that it lasted about 6 minutes. Patient states that it felt like a "charlie horse" but it concerned her because it lasted longer then it has in the past.

## 2015-07-09 NOTE — ED Notes (Signed)
Per PA, patient okay for D/C at this time.

## 2015-08-05 ENCOUNTER — Emergency Department (HOSPITAL_COMMUNITY)
Admission: EM | Admit: 2015-08-05 | Discharge: 2015-08-05 | Disposition: A | Discharge status: Home / Self Care | Payer: BLUE CROSS/BLUE SHIELD | Attending: Emergency Medicine | Admitting: Emergency Medicine

## 2015-08-05 ENCOUNTER — Emergency Department (HOSPITAL_COMMUNITY): Payer: BLUE CROSS/BLUE SHIELD

## 2015-08-05 ENCOUNTER — Encounter (HOSPITAL_COMMUNITY): Payer: Self-pay

## 2015-08-05 DIAGNOSIS — Z79899 Other long term (current) drug therapy: Secondary | ICD-10-CM | POA: Insufficient documentation

## 2015-08-05 DIAGNOSIS — J45901 Unspecified asthma with (acute) exacerbation: Secondary | ICD-10-CM

## 2015-08-05 MED ORDER — PREDNISONE 50 MG PO TABS
60.0000 mg | ORAL_TABLET | Freq: Once | ORAL | Status: AC
  Administered 2015-08-05: 60 mg via ORAL
  Filled 2015-08-05: qty 1

## 2015-08-05 MED ORDER — ALBUTEROL SULFATE HFA 108 (90 BASE) MCG/ACT IN AERS
2.0000 | INHALATION_SPRAY | RESPIRATORY_TRACT | Status: DC | PRN
  Administered 2015-08-05: 2 via RESPIRATORY_TRACT
  Filled 2015-08-05: qty 6.7

## 2015-08-05 MED ORDER — IPRATROPIUM-ALBUTEROL 0.5-2.5 (3) MG/3ML IN SOLN
3.0000 mL | Freq: Once | RESPIRATORY_TRACT | Status: AC
  Administered 2015-08-05: 3 mL via RESPIRATORY_TRACT
  Filled 2015-08-05: qty 3

## 2015-08-05 MED ORDER — PREDNISONE 20 MG PO TABS: 40.0000 mg | ORAL_TABLET | Freq: Every day | ORAL | Status: DC

## 2015-08-05 NOTE — Discharge Instructions (Signed)
Asthma, Adult Asthma is a recurring condition in which the airways tighten and narrow. Asthma can make it difficult to breathe. It can cause coughing, wheezing, and shortness of breath. Asthma episodes, also called asthma attacks, range from minor to life-threatening. Asthma cannot be cured, but medicines and lifestyle changes can help control it. CAUSES Asthma is believed to be caused by inherited (genetic) and environmental factors, but its exact cause is unknown. Asthma may be triggered by allergens, lung infections, or irritants in the air. Asthma triggers are different for each person. Common triggers include:   Animal dander.  Dust mites.  Cockroaches.  Pollen from trees or grass.  Mold.  Smoke.  Air pollutants such as dust, household cleaners, hair sprays, aerosol sprays, paint fumes, strong chemicals, or strong odors.  Cold air, weather changes, and winds (which increase molds and pollens in the air).  Strong emotional expressions such as crying or laughing hard.  Stress.  Certain medicines (such as aspirin) or types of drugs (such as beta-blockers).  Sulfites in foods and drinks. Foods and drinks that may contain sulfites include dried fruit, potato chips, and sparkling grape juice.  Infections or inflammatory conditions such as the flu, a cold, or an inflammation of the nasal membranes (rhinitis).  Gastroesophageal reflux disease (GERD).  Exercise or strenuous activity. SYMPTOMS Symptoms may occur immediately after asthma is triggered or many hours later. Symptoms include:  Wheezing.  Excessive nighttime or early morning coughing.  Frequent or severe coughing with a common cold.  Chest tightness.  Shortness of breath. DIAGNOSIS  The diagnosis of asthma is made by a review of your medical history and a physical exam. Tests may also be performed. These may include:  Lung function studies. These tests show how much air you breathe in and out.  Allergy  tests.  Imaging tests such as X-rays. TREATMENT  Asthma cannot be cured, but it can usually be controlled. Treatment involves identifying and avoiding your asthma triggers. It also involves medicines. There are 2 classes of medicine used for asthma treatment:   Controller medicines. These prevent asthma symptoms from occurring. They are usually taken every day.  Reliever or rescue medicines. These quickly relieve asthma symptoms. They are used as needed and provide short-term relief. Your health care provider will help you create an asthma action plan. An asthma action plan is a written plan for managing and treating your asthma attacks. It includes a list of your asthma triggers and how they may be avoided. It also includes information on when medicines should be taken and when their dosage should be changed. An action plan may also involve the use of a device called a peak flow meter. A peak flow meter measures how well the lungs are working. It helps you monitor your condition. HOME CARE INSTRUCTIONS   Take medicines only as directed by your health care provider. Speak with your health care provider if you have questions about how or when to take the medicines.  Use a peak flow meter as directed by your health care provider. Record and keep track of readings.  Understand and use the action plan to help minimize or stop an asthma attack without needing to seek medical care.  Control your home environment in the following ways to help prevent asthma attacks:  Do not smoke. Avoid being exposed to secondhand smoke.  Change your heating and air conditioning filter regularly.  Limit your use of fireplaces and wood stoves.  Get rid of pests (such as roaches   and mice) and their droppings.  Throw away plants if you see mold on them.  Clean your floors and dust regularly. Use unscented cleaning products.  Try to have someone else vacuum for you regularly. Stay out of rooms while they are  being vacuumed and for a short while afterward. If you vacuum, use a dust mask from a hardware store, a double-layered or microfilter vacuum cleaner bag, or a vacuum cleaner with a HEPA filter.  Replace carpet with wood, tile, or vinyl flooring. Carpet can trap dander and dust.  Use allergy-proof pillows, mattress covers, and box spring covers.  Wash bed sheets and blankets every week in hot water and dry them in a dryer.  Use blankets that are made of polyester or cotton.  Clean bathrooms and kitchens with bleach. If possible, have someone repaint the walls in these rooms with mold-resistant paint. Keep out of the rooms that are being cleaned and painted.  Wash hands frequently. SEEK MEDICAL CARE IF:   You have wheezing, shortness of breath, or a cough even if taking medicine to prevent attacks.  The colored mucus you cough up (sputum) is thicker than usual.  Your sputum changes from clear or white to yellow, green, gray, or bloody.  You have any problems that may be related to the medicines you are taking (such as a rash, itching, swelling, or trouble breathing).  You are using a reliever medicine more than 2-3 times per week.  Your peak flow is still at 50-79% of your personal best after following your action plan for 1 hour.  You have a fever. SEEK IMMEDIATE MEDICAL CARE IF:   You seem to be getting worse and are unresponsive to treatment during an asthma attack.  You are short of breath even at rest.  You get short of breath when doing very little physical activity.  You have difficulty eating, drinking, or talking due to asthma symptoms.  You develop chest pain.  You develop a fast heartbeat.  You have a bluish color to your lips or fingernails.  You are light-headed, dizzy, or faint.  Your peak flow is less than 50% of your personal best.   This information is not intended to replace advice given to you by your health care provider. Make sure you discuss any  questions you have with your health care provider.   Document Released: 02/04/2005 Document Revised: 10/26/2014 Document Reviewed: 09/03/2012 Elsevier Interactive Patient Education 2016 [employer]  

## 2015-08-05 NOTE — ED Notes (Signed)
Patient states 15 minutes PTA began cough and SOB, history of asthma, with no relief from inhaler

## 2015-08-05 NOTE — ED Provider Notes (Signed)
CSN: [id number]     Arrival date & time 08/05/15  2021 History   First MD Initiated Contact with Patient 08/05/15 2032     Chief Complaint  Patient presents with  . Asthma      Patient is a 55 y.o. female presenting with asthma. The history is provided by the patient.  Asthma Associated symptoms include shortness of breath. Pertinent negatives include no chest pain, no abdominal pain and no headaches.  Patient has shortness of breath the last 10 days. She's had a cough. States she always seems to flareup this time a year. She has an albuterol inhaler and states it has not helped much. Slight chest tightness. No fevers. 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 States her asthma is overall poor well-controlled. She does not smoke. No intubations. She is on albuterol but not a maintenance medicine. She states she has been less well controlled over the last couple weeks or worse today.  Past Medical History  Diagnosis Date  . Asthma    History reviewed. No pertinent past surgical history. History reviewed. No pertinent family history. Social History  Substance Use Topics  . Smoking status: Never Smoker   . Smokeless tobacco: None  . Alcohol Use: No   OB History    No data available     Review of Systems  Constitutional: Negative for activity change and appetite change.  Eyes: Negative for pain.  Respiratory: Positive for cough, chest tightness, shortness of breath and wheezing.   Cardiovascular: Negative for chest pain and leg swelling.  Gastrointestinal: Negative for nausea, vomiting, abdominal pain and diarrhea.  Genitourinary: Negative for flank pain.  Musculoskeletal: Negative for back pain and neck stiffness.  Skin: Negative for rash.  Neurological: Negative for weakness, numbness and headaches.  Psychiatric/Behavioral: Negative for behavioral problems.      Allergies  Dye fdc red and Shrimp  Home Medications    Prior to Admission medications   Medication Sig Start Date End Date Taking? Authorizing Provider  albuterol (PROVENTIL HFA;VENTOLIN HFA) 108 (90 Base) MCG/ACT inhaler Inhale 2 puffs into the lungs every 4 (four) hours as needed for wheezing or shortness of breath. 04/19/15  Yes Christiane HaJonathan D Cuthriell, PA-C  cetirizine (ZYRTEC) 10 MG tablet Take 1 tablet (10 mg total) by mouth daily. 04/19/15  Yes Delorise RoyalsJonathan D Cuthriell, PA-C  Norgestim-Eth Estrad Triphasic (ORTHO TRI-CYCLEN LO PO) Take 1 tablet by mouth daily.   Yes Historical Provider, MD  predniSONE (DELTASONE) 20 MG tablet Take 2 tablets (40 mg total) by mouth daily. 08/05/15   Benjiman CoreNathan Dijuan Sleeth, MD   BP 129/77 mmHg  Pulse 77  Temp(Src) 98.6 F (37 C) (Oral)  Resp 15  Ht 5' 5.5" (1.664 m)  Wt 179 lb (81.194 kg)  BMI 29.32 kg/m2  SpO2 100%  LMP 06/29/2015 (Approximate) Physical Exam  Constitutional: She appears well-developed.  Eyes: EOM are normal.  Neck: Neck supple.  Pulmonary/Chest:  Mildly harsh breath sounds with some dyspnea and mild wheezes. No rales.  Abdominal: Soft.  Musculoskeletal: Normal range of motion.  Neurological: She is alert.  Skin: Skin is warm.    ED Course  Procedures (including critical care time) Labs Review Labs Reviewed - No data to display  Imaging Review Dg Chest 2 View  08/05/2015  CLINICAL DATA:  Short of breath.  Productive cough for 2 weeks. EXAM: CHEST  2 VIEW COMPARISON:  04/19/2015 FINDINGS: Cardiac silhouette is normal in size  and configuration. No mediastinal or hilar masses or evidence of adenopathy. There are low lung volumes. Lungs are clear. No pleural effusion or pneumothorax. Skeletal structures are unremarkable. IMPRESSION: No active cardiopulmonary disease. Electronically Signed   By: Amie Portland M.D.   On: 08/05/2015 21:10   I have personally reviewed and evaluated these images and lab results as part of my medical decision-making.   EKG Interpretation None      MDM   Final  diagnoses:  Asthma exacerbation    Patient begins her shortness of breath. Likely asthma exacerbation. Feels better after treatment but still has some dyspnea. Will give prednisone and inhaler and have her follow-up as it does not appear to need admission at this time. X-ray does not show pneumonia.    Benjiman Core, MD 08/05/15 2213

## 2015-09-14 ENCOUNTER — Encounter: Payer: Self-pay | Admitting: Emergency Medicine

## 2015-09-14 ENCOUNTER — Emergency Department
Admission: EM | Admit: 2015-09-14 | Discharge: 2015-09-14 | Disposition: A | Discharge status: Home / Self Care | Payer: BLUE CROSS/BLUE SHIELD | Attending: Emergency Medicine | Admitting: Emergency Medicine

## 2015-09-14 DIAGNOSIS — H6981 Other specified disorders of Eustachian tube, right ear: Secondary | ICD-10-CM

## 2015-09-14 DIAGNOSIS — J0100 Acute maxillary sinusitis, unspecified: Secondary | ICD-10-CM | POA: Insufficient documentation

## 2015-09-14 DIAGNOSIS — H6991 Unspecified Eustachian tube disorder, right ear: Secondary | ICD-10-CM | POA: Insufficient documentation

## 2015-09-14 DIAGNOSIS — J45909 Unspecified asthma, uncomplicated: Secondary | ICD-10-CM | POA: Insufficient documentation

## 2015-09-14 DIAGNOSIS — Z79899 Other long term (current) drug therapy: Secondary | ICD-10-CM | POA: Insufficient documentation

## 2015-09-14 MED ORDER — CETIRIZINE HCL 10 MG PO TABS: 10.0000 mg | ORAL_TABLET | Freq: Every day | ORAL | 0 refills | Status: AC

## 2015-09-14 MED ORDER — FLUTICASONE PROPIONATE 50 MCG/ACT NA SUSP: 1.0000 | Freq: Two times a day (BID) | NASAL | 0 refills | Status: AC

## 2015-09-14 MED ORDER — AMOXICILLIN-POT CLAVULANATE 875-125 MG PO TABS: 1.0000 | ORAL_TABLET | Freq: Two times a day (BID) | ORAL | 0 refills | Status: DC

## 2015-09-14 NOTE — ED Provider Notes (Signed)
[facility] Emergency Department Provider Note  ____________________________________________  Time seen: Approximately 7:33 PM  I have reviewed the triage vital signs and the nursing notes.   HISTORY  Chief Complaint Otalgia    HPI Debbie Leon is a 55 y.o. female who presents to emergency department complaining of right ear pain. Patient states that she has had some intermittent pain since having dental work done3 months ago. Patient states that she had a tooth extracted. Patient reports over the last week she has had an increase in pain to her right ear. Today became worse the preceding days. Patient does endorse some nasal congestion and sinus pressure. Patient reports that she is supposed to be on Zyrtec but does not take same. Patient denies any headache, fevers or chills, and visual changes, neck pain, chest pain, shortness of breath, abdominal pain, nausea or vomiting.   Past Medical History:  Diagnosis Date  . Asthma     There are no active problems to display for this patient.   History reviewed. No pertinent surgical history.  Prior to Admission medications   Medication Sig Start Date End Date Taking? Authorizing Provider  albuterol (PROVENTIL HFA;VENTOLIN HFA) 108 (90 Base) MCG/ACT inhaler Inhale 2 puffs into the lungs every 4 (four) hours as needed for wheezing or shortness of breath. 04/19/15   Delorise Royals Cuthriell, PA-C  amoxicillin-clavulanate (AUGMENTIN) 875-125 MG tablet Take 1 tablet by mouth 2 (two) times daily. 09/14/15   Delorise Royals Cuthriell, PA-C  cetirizine (ZYRTEC) 10 MG tablet Take 1 tablet (10 mg total) by mouth daily. 09/14/15   Delorise Royals Cuthriell, PA-C  fluticasone (FLONASE) 50 MCG/ACT nasal spray Place 1 spray into both nostrils 2 (two) times daily. 09/14/15   Delorise Royals Cuthriell, PA-C  Norgestim-Eth Estrad Triphasic (ORTHO TRI-CYCLEN LO PO) Take 1 tablet by mouth daily.    Historical Provider, MD    Allergies Dye fdc red  [red dye] and Shrimp [shellfish allergy]  No family history on file.  Social History Social History  Substance Use Topics  . Smoking status: Never Smoker  . Smokeless tobacco: Never Used  . Alcohol use No     Review of Systems  Constitutional: No fever/chills Eyes: No visual changes. No discharge ENT: As per nasal congestion, sneezing, sinus pain, right ear pain. Cardiovascular: no chest pain. Respiratory: no cough. No SOB. Gastrointestinal: No abdominal pain.  No nausea, no vomiting.   Musculoskeletal: Negative for musculoskeletal pain. Skin: Negative for rash, abrasions, lacerations, ecchymosis. Neurological: Negative for headaches, focal weakness or numbness. 10-point ROS otherwise negative.  ____________________________________________   PHYSICAL EXAM:  VITAL SIGNS: ED Triage Vitals  Enc Vitals Group     BP 09/14/15 1853 139/81     Pulse Rate 09/14/15 1853 67     Resp 09/14/15 1853 16     Temp 09/14/15 1853 98.5 F (36.9 C)     Temp Source 09/14/15 1853 Oral     SpO2 09/14/15 1853 98 %     Weight 09/14/15 1853 179 lb (81.2 kg)     Height 09/14/15 1853  (1.651 m)     Head Circumference --      Peak Flow --      Pain Score 09/14/15 1854 10     Pain Loc --      Pain Edu? --      Excl. in GC? --      Constitutional: Alert and oriented. Well appearing and in no acute distress. Eyes: Conjunctivae are  normal. PERRL. EOMI. Head: Atraumatic. ENT:      Ears: EACs are unremarkable bilaterally. Mild bulging of the TM right side. Left side TM unremarkable.      Nose: Mild clear congestion/rhinnorhea. Turbinates are edematous and erythematous.      Mouth/Throat: Mucous membranes are moist. Pharynx is not erythematous, nonedematous, uvula is midline, tonsils are unremarkable bilaterally. Neck: No stridor. No lymphadenopathy.   Cardiovascular: Normal rate, regular rhythm. Normal S1 and S2.  Good peripheral circulation. Respiratory: Normal respiratory effort  without tachypnea or retractions. Lungs CTAB. Good air entry to the bases with no decreased or absent breath sounds. Musculoskeletal: Full range of motion to all extremities. No gross deformities appreciated. Neurologic:  Normal speech and language. No gross focal neurologic deficits are appreciated.  Skin:  Skin is warm, dry and intact. No rash noted. Psychiatric: Mood and affect are normal. Speech and behavior are normal. Patient exhibits appropriate insight and judgement.   ____________________________________________   LABS (all labs ordered are listed, but only abnormal results are displayed)  Labs Reviewed - No data to display ____________________________________________  EKG   ____________________________________________  RADIOLOGY   No results found.  ____________________________________________    PROCEDURES  Procedure(s) performed:    Procedures    Medications - No data to display   ____________________________________________   INITIAL IMPRESSION / ASSESSMENT AND PLAN / ED COURSE  Pertinent labs & imaging results that were available during my care of the patient were reviewed by me and considered in my medical decision making (see chart for details).  Clinical Course    Patient's diagnosis is consistent with Acute maxillary sinusitis with right-sided eustachian tube dysfunction. Patient will be discharged home with prescriptions for antibiotics, Flonase, Zyrtec. Patient is to follow up with primary care provider as needed or otherwise directed. Patient is given ED precautions to return to the ED for any worsening or new symptoms.     ____________________________________________  FINAL CLINICAL IMPRESSION(S) / ED DIAGNOSES  Final diagnoses:  Acute maxillary sinusitis, recurrence not specified  Eustachian tube dysfunction, right      NEW MEDICATIONS STARTED DURING THIS VISIT:  New Prescriptions   AMOXICILLIN-CLAVULANATE (AUGMENTIN)  875-125 MG TABLET    Take 1 tablet by mouth 2 (two) times daily.   CETIRIZINE (ZYRTEC) 10 MG TABLET    Take 1 tablet (10 mg total) by mouth daily.   FLUTICASONE (FLONASE) 50 MCG/ACT NASAL SPRAY    Place 1 spray into both nostrils 2 (two) times daily.        This chart was dictated using voice recognition software/Dragon. Despite best efforts to proofread, errors can occur which can change the meaning. Any change was purely unintentional.    Racheal Patches, PA-C 09/14/15 1946    Minna Antis, MD 09/14/15 2356

## 2015-09-14 NOTE — ED Triage Notes (Signed)
Presents with pain to right ear and right side of face for about 2 weeks. Unsure if the pain is coming from ear or sinus

## 2015-09-14 NOTE — ED Notes (Signed)
Discharge instructions reviewed with patient. Patient verbalized understanding. Patient ambulated to lobby without difficulty.   

## 2015-12-05 ENCOUNTER — Encounter: Payer: Self-pay | Admitting: Emergency Medicine

## 2015-12-05 ENCOUNTER — Emergency Department
Admission: EM | Admit: 2015-12-05 | Discharge: 2015-12-05 | Disposition: A | Discharge status: Home / Self Care | Payer: BLUE CROSS/BLUE SHIELD | Attending: Emergency Medicine | Admitting: Emergency Medicine

## 2015-12-05 DIAGNOSIS — K0510 Chronic gingivitis, plaque induced: Secondary | ICD-10-CM | POA: Insufficient documentation

## 2015-12-05 DIAGNOSIS — K029 Dental caries, unspecified: Secondary | ICD-10-CM | POA: Insufficient documentation

## 2015-12-05 DIAGNOSIS — J45909 Unspecified asthma, uncomplicated: Secondary | ICD-10-CM | POA: Insufficient documentation

## 2015-12-05 DIAGNOSIS — R0981 Nasal congestion: Secondary | ICD-10-CM

## 2015-12-05 DIAGNOSIS — Z79899 Other long term (current) drug therapy: Secondary | ICD-10-CM | POA: Insufficient documentation

## 2015-12-05 MED ORDER — ALBUTEROL SULFATE HFA 108 (90 BASE) MCG/ACT IN AERS: 2.0000 | INHALATION_SPRAY | Freq: Four times a day (QID) | RESPIRATORY_TRACT | 2 refills | Status: DC | PRN

## 2015-12-05 MED ORDER — IBUPROFEN 600 MG PO TABS
600.0000 mg | ORAL_TABLET | Freq: Four times a day (QID) | ORAL | 0 refills | Status: DC | PRN
Start: 2015-12-05 — End: 2016-01-09

## 2015-12-05 MED ORDER — TRAMADOL HCL 50 MG PO TABS: 50.0000 mg | ORAL_TABLET | Freq: Four times a day (QID) | ORAL | 0 refills | Status: DC | PRN

## 2015-12-05 MED ORDER — AMOXICILLIN 500 MG PO CAPS: 500.0000 mg | ORAL_CAPSULE | Freq: Three times a day (TID) | ORAL | 0 refills | Status: DC

## 2015-12-05 MED ORDER — PSEUDOEPH-BROMPHEN-DM 30-2-10 MG/5ML PO SYRP: 5.0000 mL | ORAL_SOLUTION | Freq: Four times a day (QID) | ORAL | 0 refills | Status: DC | PRN

## 2015-12-05 NOTE — ED Provider Notes (Signed)
[facility] Emergency Department Provider Note   ____________________________________________   First MD Initiated Contact with Patient 12/05/15 1456     (approximate)  I have reviewed the triage vital signs and the nursing notes.   HISTORY  Chief Complaint Dental Pain    HPI Debbie Leon is a 55 y.o. female patient complaining of right upper and lower dental pain is radiating to her right ear. Patient state she's has continued problems with dental issues status post extraction of teeth 6 months ago. Patient also is complaining of nasal congestion and productive cough. Patient also states she's having nocturnal wheezing. Patient has a history of asthma and states the weather changes has caused  her complaint.Patient requests refill of albuterol inhaler. Patient rates her dental pain as a 10 over 10. Patient described a pain as "sharp". No palliative measures taken for this complaint.  Past Medical History:  Diagnosis Date  . Asthma     There are no active problems to display for this patient.   History reviewed. No pertinent surgical history.  Prior to Admission medications   Medication Sig Start Date End Date Taking? Authorizing Provider  albuterol (PROVENTIL HFA;VENTOLIN HFA) 108 (90 Base) MCG/ACT inhaler Inhale 2 puffs into the lungs every 4 (four) hours as needed for wheezing or shortness of breath. 04/19/15   Delorise RoyalsJonathan D Cuthriell, PA-C  albuterol (PROVENTIL HFA;VENTOLIN HFA) 108 (90 Base) MCG/ACT inhaler Inhale 2 puffs into the lungs every 6 (six) hours as needed for wheezing or shortness of breath. 12/05/15   Joni Reiningonald K Kismet Facemire, PA-C  amoxicillin (AMOXIL) 500 MG capsule Take 1 capsule (500 mg total) by mouth 3 (three) times daily. 12/05/15   Joni Reiningonald K Raiford Fetterman, PA-C  amoxicillin-clavulanate (AUGMENTIN) 875-125 MG tablet Take 1 tablet by mouth 2 (two) times daily. 09/14/15   Delorise RoyalsJonathan D Cuthriell, PA-C  brompheniramine-pseudoephedrine-DM 30-2-10 MG/5ML syrup  Take 5 mLs by mouth 4 (four) times daily as needed. 12/05/15   Joni Reiningonald K Peter Daquila, PA-C  cetirizine (ZYRTEC) 10 MG tablet Take 1 tablet (10 mg total) by mouth daily. 09/14/15   Delorise RoyalsJonathan D Cuthriell, PA-C  fluticasone (FLONASE) 50 MCG/ACT nasal spray Place 1 spray into both nostrils 2 (two) times daily. 09/14/15   Delorise RoyalsJonathan D Cuthriell, PA-C  ibuprofen (ADVIL,MOTRIN) 600 MG tablet Take 1 tablet (600 mg total) by mouth every 6 (six) hours as needed. 12/05/15   Joni Reiningonald K Yarah Fuente, PA-C  Norgestim-Eth Charlott HollerEstrad Triphasic (ORTHO TRI-CYCLEN LO PO) Take 1 tablet by mouth daily.    Historical Provider, MD  traMADol (ULTRAM) 50 MG tablet Take 1 tablet (50 mg total) by mouth every 6 (six) hours as needed. 12/05/15 12/04/16  Joni Reiningonald K Coley Littles, PA-C    Allergies Dye fdc red [red dye] and Shrimp [shellfish allergy]  History reviewed. No pertinent family history.  Social History Social History  Substance Use Topics  . Smoking status: Never Smoker  . Smokeless tobacco: Never Used  . Alcohol use No    Review of Systems Constitutional: No fever/chills Eyes: No visual changes. ENT: No sore throat. Dental pain and nasal congestion. Cardiovascular: Denies chest pain. Respiratory: Denies shortness of breath. Nocturnal wheezing and cough. Gastrointestinal: No abdominal pain.  No nausea, no vomiting.  No diarrhea.  No constipation. Genitourinary: Negative for dysuria. Musculoskeletal: Negative for back pain. Skin: Negative for rash. Neurological: Negative for headaches, focal weakness or numbness. Allergic/Immunilogical: See medication list ___________________________________________   PHYSICAL EXAM:  VITAL SIGNS: ED Triage Vitals  Enc Vitals Group  BP 12/05/15 1440 (!) 162/80     Pulse Rate 12/05/15 1440 75     Resp 12/05/15 1440 18     Temp 12/05/15 1440 98.2 F (36.8 C)     Temp Source 12/05/15 1440 Oral     SpO2 12/05/15 1440 100 %     Weight 12/05/15 1441 179 lb (81.2 kg)     Height --      Head  Circumference --      Peak Flow --      Pain Score 12/05/15 1441 10     Pain Loc --      Pain Edu? --      Excl. in GC? --     Constitutional: Alert and oriented. Well appearing and in no acute distress. Eyes: Conjunctivae are normal. PERRL. EOMI. Head: Atraumatic. Nose: No congestion/rhinnorhea. Mouth/Throat: Mucous membranes are moist.  Oropharynx non-erythematous. Edematous and erythematous gingiva with multiple devitalized teeth. Neck: No stridor.  No cervical spine tenderness to palpation. Hematological/Lymphatic/Immunilogical: No cervical lymphadenopathy. Cardiovascular: Normal rate, regular rhythm. Grossly normal heart sounds.  Good peripheral circulation. Respiratory: Normal respiratory effort.  No retractions. Lungs CTAB. Gastrointestinal: Soft and nontender. No distention. No abdominal bruits. No CVA tenderness. Musculoskeletal: No lower extremity tenderness nor edema.  No joint effusions. Neurologic:  Normal speech and language. No gross focal neurologic deficits are appreciated. No gait instability. Skin:  Skin is warm, dry and intact. No rash noted. Psychiatric: Mood and affect are normal. Speech and behavior are normal.  ____________________________________________   LABS (all labs ordered are listed, but only abnormal results are displayed)  Labs Reviewed - No data to display ____________________________________________  EKG   ____________________________________________  RADIOLOGY   ____________________________________________   PROCEDURES  Procedure(s) performed: None  Procedures  Critical Care performed: No  ____________________________________________   INITIAL IMPRESSION / ASSESSMENT AND PLAN / ED COURSE  Pertinent labs & imaging results that were available during my care of the patient were reviewed by me and considered in my medical decision making (see chart for details).  Gingivitis and dental caries. Medication refill. Patient given  discharge care instructions. Patient advised to follow-up with previous treatment). Patient given a prescription for amoxicillin, tramadol, ibuprofen, and albuterol inhaler.  Clinical Course     ____________________________________________   FINAL CLINICAL IMPRESSION(S) / ED DIAGNOSES  Final diagnoses:  Gingivitis  Nasal congestion      NEW MEDICATIONS STARTED DURING THIS VISIT:  New Prescriptions   ALBUTEROL (PROVENTIL HFA;VENTOLIN HFA) 108 (90 BASE) MCG/ACT INHALER    Inhale 2 puffs into the lungs every 6 (six) hours as needed for wheezing or shortness of breath.   AMOXICILLIN (AMOXIL) 500 MG CAPSULE    Take 1 capsule (500 mg total) by mouth 3 (three) times daily.   BROMPHENIRAMINE-PSEUDOEPHEDRINE-DM 30-2-10 MG/5ML SYRUP    Take 5 mLs by mouth 4 (four) times daily as needed.   IBUPROFEN (ADVIL,MOTRIN) 600 MG TABLET    Take 1 tablet (600 mg total) by mouth every 6 (six) hours as needed.   TRAMADOL (ULTRAM) 50 MG TABLET    Take 1 tablet (50 mg total) by mouth every 6 (six) hours as needed.     Note:  This document was prepared using Dragon voice recognition software and may include unintentional dictation errors.    Joni Reining, PA-C 12/05/15 1518    Sharman Cheek, MD 12/06/15 [phone]

## 2015-12-05 NOTE — ED Notes (Signed)
States she had a tooth pulled about 6 months ago  Has had cont'd swelling and pain to right gumline,ear and side of face

## 2015-12-05 NOTE — ED Triage Notes (Signed)
Pt to ed with c/o gum pain and swelling and right ear pain x several days.

## 2016-01-09 ENCOUNTER — Encounter: Payer: Self-pay | Admitting: Emergency Medicine

## 2016-01-09 ENCOUNTER — Emergency Department
Admission: EM | Admit: 2016-01-09 | Discharge: 2016-01-09 | Disposition: A | Discharge status: Home / Self Care | Payer: Self-pay | Attending: Emergency Medicine | Admitting: Emergency Medicine

## 2016-01-09 DIAGNOSIS — K0889 Other specified disorders of teeth and supporting structures: Secondary | ICD-10-CM | POA: Insufficient documentation

## 2016-01-09 DIAGNOSIS — J45909 Unspecified asthma, uncomplicated: Secondary | ICD-10-CM | POA: Insufficient documentation

## 2016-01-09 DIAGNOSIS — K089 Disorder of teeth and supporting structures, unspecified: Secondary | ICD-10-CM

## 2016-01-09 DIAGNOSIS — Z79899 Other long term (current) drug therapy: Secondary | ICD-10-CM | POA: Insufficient documentation

## 2016-01-09 DIAGNOSIS — G8929 Other chronic pain: Secondary | ICD-10-CM | POA: Insufficient documentation

## 2016-01-09 MED ORDER — PENICILLIN V POTASSIUM 500 MG PO TABS: 500.0000 mg | ORAL_TABLET | Freq: Four times a day (QID) | ORAL | 0 refills | Status: DC

## 2016-01-09 MED ORDER — IBUPROFEN 600 MG PO TABS: 600.0000 mg | ORAL_TABLET | Freq: Three times a day (TID) | ORAL | 0 refills | Status: DC | PRN

## 2016-01-09 NOTE — ED Triage Notes (Signed)
Reports toothache rad to right ear.

## 2016-01-09 NOTE — ED Provider Notes (Signed)
[facility] Emergency Department Provider Note  ____________________________________________   First MD Initiated Contact with Patient 01/09/16 1030     (approximate)  I have reviewed the triage vital signs and the nursing notes.   HISTORY  Chief Complaint Dental Problem    HPI Debbie Leon is a 55 y.o. female is here with complaint of toothache that is radiating into her right ear. Patient states that she had tooth pulled approximately one year ago and has continued to have problems. She states that she feels as if there was fragments of tooth left inside. She has not made an appointment with her dentist for evaluation of this. She also states that amoxicillin does not help her them infections any longer. Only she rates her pain as 10 over 10. She denies any fever or chills.   Past Medical History:  Diagnosis Date  . Asthma     There are no active problems to display for this patient.   History reviewed. No pertinent surgical history.  Prior to Admission medications   Medication Sig Start Date End Date Taking? Authorizing Provider  albuterol (PROVENTIL HFA;VENTOLIN HFA) 108 (90 Base) MCG/ACT inhaler Inhale 2 puffs into the lungs every 4 (four) hours as needed for wheezing or shortness of breath. 04/19/15   Delorise RoyalsJonathan D Cuthriell, PA-C  albuterol (PROVENTIL HFA;VENTOLIN HFA) 108 (90 Base) MCG/ACT inhaler Inhale 2 puffs into the lungs every 6 (six) hours as needed for wheezing or shortness of breath. 12/05/15   Joni Reiningonald K Smith, PA-C  amoxicillin (AMOXIL) 500 MG capsule Take 1 capsule (500 mg total) by mouth 3 (three) times daily. 12/05/15   Joni Reiningonald K Smith, PA-C  amoxicillin-clavulanate (AUGMENTIN) 875-125 MG tablet Take 1 tablet by mouth 2 (two) times daily. 09/14/15   Delorise RoyalsJonathan D Cuthriell, PA-C  brompheniramine-pseudoephedrine-DM 30-2-10 MG/5ML syrup Take 5 mLs by mouth 4 (four) times daily as needed. 12/05/15   Joni Reiningonald K Smith, PA-C  cetirizine (ZYRTEC) 10  MG tablet Take 1 tablet (10 mg total) by mouth daily. 09/14/15   Delorise RoyalsJonathan D Cuthriell, PA-C  fluticasone (FLONASE) 50 MCG/ACT nasal spray Place 1 spray into both nostrils 2 (two) times daily. 09/14/15   Delorise RoyalsJonathan D Cuthriell, PA-C  ibuprofen (ADVIL,MOTRIN) 600 MG tablet Take 1 tablet (600 mg total) by mouth every 8 (eight) hours as needed. 01/09/16   Tommi Rumpshonda L Denver Bentson, PA-C  Norgestim-Eth Estrad Triphasic (ORTHO TRI-CYCLEN LO PO) Take 1 tablet by mouth daily.    Historical Provider, MD  penicillin v potassium (VEETID) 500 MG tablet Take 1 tablet (500 mg total) by mouth 4 (four) times daily. 01/09/16   Tommi Rumpshonda L Shahzad Thomann, PA-C    Allergies Dye fdc red [red dye] and Shrimp [shellfish allergy]  No family history on file.  Social History Social History  Substance Use Topics  . Smoking status: Never Smoker  . Smokeless tobacco: Never Used  . Alcohol use No    Review of Systems Constitutional: No fever/chills Eyes: No visual changes. ENT: No sore throat.Positive dental pain. Cardiovascular: Denies chest pain. Respiratory: Denies shortness of breath. Gastrointestinal:  No nausea, no vomiting.   Neurological: Negative for headaches  10-point ROS otherwise negative.  ____________________________________________   PHYSICAL EXAM:  VITAL SIGNS: ED Triage Vitals  Enc Vitals Group     BP 01/09/16 1011 (!) 148/86     Pulse Rate 01/09/16 1011 74     Resp 01/09/16 1011 18     Temp 01/09/16 1011 97.8 F (36.6 C)  Temp Source 01/09/16 1011 Oral     SpO2 01/09/16 1011 100 %     Weight 01/09/16 1012 180 lb (81.6 kg)     Height 01/09/16 1012 5\' 6"  (1.676 m)     Head Circumference --      Peak Flow --      Pain Score 01/09/16 1012 10     Pain Loc --      Pain Edu? --      Excl. in GC? --     Constitutional: Alert and oriented. Well appearing and in no acute distress. Eyes: Conjunctivae are normal. PERRL. EOMI. Head: Atraumatic. Nose: No congestion/rhinnorhea. Mouth/Throat: Mucous  membranes are moist.  Oropharynx non-erythematous. There is tenderness on palpation of the gum without obvious evidence of infection or drainage. Gum is well-healed from one year ago. Neck: No stridor.   Hematological/Lymphatic/Immunilogical: No cervical lymphadenopathy. Cardiovascular: Normal rate, regular rhythm. Grossly normal heart sounds.  Good peripheral circulation. Respiratory: Normal respiratory effort.  No retractions. Lungs CTAB. Musculoskeletal: Moves upper and lower extremities without any difficulty. Normal gait was noted. Neurologic:  Normal speech and language. No gross focal neurologic deficits are appreciated. No gait instability. Skin:  Skin is warm, dry and intact. No rash noted. Psychiatric: Mood and affect are normal. Speech and behavior are normal.  ____________________________________________   LABS (all labs ordered are listed, but only abnormal results are displayed)  Labs Reviewed - No data to display   PROCEDURES  Procedure(s) performed: None  Procedures  Critical Care performed: No  ____________________________________________   INITIAL IMPRESSION / ASSESSMENT AND PLAN / ED COURSE  Pertinent labs & imaging results that were available during my care of the patient were reviewed by me and considered in my medical decision making (see chart for details).    Clinical Course    [facility] to [facility] to make an appointment with a dentist. In the meantime she is given a prescription for Pen-Vee K 500 mg 4 times a day and she will continue taking ibuprofen as needed for pain.  ____________________________________________   FINAL CLINICAL IMPRESSION(S) / ED DIAGNOSES  Final diagnoses:  Pain, dental  Chronic dental pain      NEW MEDICATIONS STARTED DURING THIS VISIT:  Discharge Medication List as of 01/09/2016 10:56 AM    START taking these medications   Details  penicillin v potassium (VEETID) 500 MG tablet Take 1 tablet (500 mg  total) by mouth 4 (four) times daily., Starting Tue 01/09/2016, Print         Note:  This document was prepared using Dragon voice recognition software and may include unintentional dictation errors.    Tommi RumpsRhonda L Mahdi Frye, PA-C 01/09/16 1542    Jennye MoccasinBrian S Quigley, MD 01/09/16 [phone]

## 2016-02-26 ENCOUNTER — Emergency Department
Admission: EM | Admit: 2016-02-26 | Discharge: 2016-02-26 | Disposition: A | Discharge status: Home / Self Care | Payer: Self-pay | Attending: Emergency Medicine | Admitting: Emergency Medicine

## 2016-02-26 DIAGNOSIS — J45909 Unspecified asthma, uncomplicated: Secondary | ICD-10-CM | POA: Insufficient documentation

## 2016-02-26 DIAGNOSIS — Z79899 Other long term (current) drug therapy: Secondary | ICD-10-CM | POA: Insufficient documentation

## 2016-02-26 DIAGNOSIS — K529 Noninfective gastroenteritis and colitis, unspecified: Secondary | ICD-10-CM | POA: Insufficient documentation

## 2016-02-26 DIAGNOSIS — Z791 Long term (current) use of non-steroidal anti-inflammatories (NSAID): Secondary | ICD-10-CM | POA: Insufficient documentation

## 2016-02-26 LAB — COMPREHENSIVE METABOLIC PANEL
ALT: 20 U/L (ref 14–54)
AST: 30 U/L (ref 15–41)
Albumin: 3.7 g/dL (ref 3.5–5.0)
Alkaline Phosphatase: 65 U/L (ref 38–126)
Anion gap: 5 (ref 5–15)
BUN: 19 mg/dL (ref 6–20)
CO2: 27 mmol/L (ref 22–32)
Calcium: 8.6 mg/dL — ABNORMAL LOW (ref 8.9–10.3)
Chloride: 105 mmol/L (ref 101–111)
Creatinine, Ser: 0.8 mg/dL (ref 0.44–1.00)
GFR calc Af Amer: >60 mL/min (ref >60)
GFR calc non Af Amer: >60 mL/min (ref >60)
Glucose, Bld: 111 mg/dL — ABNORMAL HIGH (ref 65–99)
Potassium: 4 mmol/L (ref 3.5–5.1)
Sodium: 137 mmol/L (ref 135–145)
Total Bilirubin: 0.5 mg/dL (ref 0.3–1.2)
Total Protein: 7.2 g/dL (ref 6.5–8.1)

## 2016-02-26 LAB — LIPASE, BLOOD: Lipase: 29 U/L (ref 11–51)

## 2016-02-26 LAB — CBC
HCT: 39.8 % (ref 35.0–47.0)
Hemoglobin: 13.5 g/dL (ref 12.0–16.0)
MCH: 30.2 pg (ref 26.0–34.0)
MCHC: 34 g/dL (ref 32.0–36.0)
MCV: 88.8 fL (ref 80.0–100.0)
Platelets: 149 10*3/uL — ABNORMAL LOW (ref 150–440)
RBC: 4.48 MIL/uL (ref 3.80–5.20)
RDW: 13.9 % (ref 11.5–14.5)
WBC: 8 10*3/uL (ref 3.6–11.0)

## 2016-02-26 MED ORDER — ONDANSETRON HCL 4 MG/2ML IJ SOLN
4.0000 mg | Freq: Once | INTRAMUSCULAR | Status: AC
  Administered 2016-02-26: 4 mg via INTRAVENOUS
  Filled 2016-02-26: qty 2

## 2016-02-26 MED ORDER — ONDANSETRON HCL 4 MG PO TABS: 4.0000 mg | ORAL_TABLET | Freq: Three times a day (TID) | ORAL | 0 refills | Status: AC | PRN

## 2016-02-26 MED ORDER — SODIUM CHLORIDE 0.9 % IV BOLUS (SEPSIS)
1000.0000 mL | Freq: Once | INTRAVENOUS | Status: AC
  Administered 2016-02-26: 1000 mL via INTRAVENOUS

## 2016-02-26 NOTE — ED Notes (Signed)
Pt discharged home after verbalizing understanding of discharge instructions; nad noted. 

## 2016-02-26 NOTE — ED Triage Notes (Signed)
Pt reports nausea, vomiting, diarrhea and lower abdominal pain that began this morning.

## 2016-02-26 NOTE — ED Provider Notes (Signed)
[facility] Emergency Department Provider Note  ____________________________________________   I have reviewed the triage vital signs and the nursing notes.   HISTORY  Chief Complaint Emesis and Diarrhea    HPI Debbie Leon is a 56 y.o. female who presents today complaining of nausea vomiting diarrhea. She has had the symptoms since early this morning. Has had more than 8 episodes of diarrhea. No ongoing abdominal discomfort. Did have some cramping. No melena bright red blood per rectum. Worse with children. Multiple sick contacts with the same. At this time her concern is for a work note principally.     Past Medical History:  Diagnosis Date  . Asthma     There are no active problems to display for this patient.   No past surgical history on file.  Prior to Admission medications   Medication Sig Start Date End Date Taking? Authorizing Provider  albuterol (PROVENTIL HFA;VENTOLIN HFA) 108 (90 Base) MCG/ACT inhaler Inhale 2 puffs into the lungs every 4 (four) hours as needed for wheezing or shortness of breath. 04/19/15   Delorise Royals Cuthriell, PA-C  albuterol (PROVENTIL HFA;VENTOLIN HFA) 108 (90 Base) MCG/ACT inhaler Inhale 2 puffs into the lungs every 6 (six) hours as needed for wheezing or shortness of breath. 12/05/15   Joni Reining, PA-C  amoxicillin (AMOXIL) 500 MG capsule Take 1 capsule (500 mg total) by mouth 3 (three) times daily. 12/05/15   Joni Reining, PA-C  amoxicillin-clavulanate (AUGMENTIN) 875-125 MG tablet Take 1 tablet by mouth 2 (two) times daily. 09/14/15   Delorise Royals Cuthriell, PA-C  brompheniramine-pseudoephedrine-DM 30-2-10 MG/5ML syrup Take 5 mLs by mouth 4 (four) times daily as needed. 12/05/15   Joni Reining, PA-C  cetirizine (ZYRTEC) 10 MG tablet Take 1 tablet (10 mg total) by mouth daily. 09/14/15   Delorise Royals Cuthriell, PA-C  fluticasone (FLONASE) 50 MCG/ACT nasal spray Place 1 spray into both nostrils 2 (two) times daily.  09/14/15   Delorise Royals Cuthriell, PA-C  ibuprofen (ADVIL,MOTRIN) 600 MG tablet Take 1 tablet (600 mg total) by mouth every 8 (eight) hours as needed. 01/09/16   Tommi Rumps, PA-C  Norgestim-Eth Estrad Triphasic (ORTHO TRI-CYCLEN LO PO) Take 1 tablet by mouth daily.    Historical Provider, MD  penicillin v potassium (VEETID) 500 MG tablet Take 1 tablet (500 mg total) by mouth 4 (four) times daily. 01/09/16   Tommi Rumps, PA-C    Allergies Dye fdc red [red dye] and Shrimp [shellfish allergy]  No family history on file.  Social History Social History  Substance Use Topics  . Smoking status: Never Smoker  . Smokeless tobacco: Never Used  . Alcohol use No    Review of Systems Constitutional: No fever/chills Eyes: No visual changes. ENT: No sore throat. No stiff neck no neck pain Cardiovascular: Denies chest pain. Respiratory: Denies shortness of breath. Gastrointestinal:  See history of present illness Genitourinary: Negative for dysuria. Musculoskeletal: Negative lower extremity swelling Skin: Negative for rash. Neurological: Negative for severe headaches, focal weakness or numbness. 10-point ROS otherwise negative.  ____________________________________________   PHYSICAL EXAM:  VITAL SIGNS: ED Triage Vitals  Enc Vitals Group     BP 02/26/16 0723 (!) 150/77     Pulse Rate 02/26/16 0723 80     Resp 02/26/16 0723 18     Temp 02/26/16 0723 98 F (36.7 C)     Temp Source 02/26/16 0723 Oral     SpO2 02/26/16 0723 99 %  Weight 02/26/16 0724 185 lb (83.9 kg)     Height 02/26/16 0724 5' 5.5" (1.664 m)     Head Circumference --      Peak Flow --      Pain Score 02/26/16 0724 10     Pain Loc --      Pain Edu? --      Excl. in GC? --     Constitutional: Alert and oriented. Well appearing and in no acute distress. Eyes: Conjunctivae are normal. PERRL. EOMI. Head: Atraumatic. Nose: No congestion/rhinnorhea. Mouth/Throat: Mucous membranes are moist.  Oropharynx  non-erythematous. Neck: No stridor.   Nontender with no meningismus Cardiovascular: Normal rate, regular rhythm. Grossly normal heart sounds.  Good peripheral circulation. Respiratory: Normal respiratory effort.  No retractions. Lungs CTAB. Abdominal: Soft and nontender. No distention. No guarding no rebound Back:  There is no focal tenderness or step off.  there is no midline tenderness there are no lesions noted. there is no CVA tenderness Musculoskeletal: No lower extremity tenderness, no upper extremity tenderness. No joint effusions, no DVT signs strong distal pulses no edema Neurologic:  Normal speech and language. No gross focal neurologic deficits are appreciated.  Skin:  Skin is warm, dry and intact. No rash noted. Psychiatric: Mood and affect are normal. Speech and behavior are normal.  ____________________________________________   LABS (all labs ordered are listed, but only abnormal results are displayed)  Labs Reviewed  COMPREHENSIVE METABOLIC PANEL - Abnormal; Notable for the following:       Result Value   Glucose, Bld 111 (*)    Calcium 8.6 (*)    All other components within normal limits  CBC - Abnormal; Notable for the following:    Platelets 149 (*)    All other components within normal limits  LIPASE, BLOOD  URINALYSIS, COMPLETE (UACMP) WITH MICROSCOPIC   ____________________________________________  EKG  I personally interpreted any EKGs ordered by me or triage  ____________________________________________  RADIOLOGY  I reviewed any imaging ordered by me or triage that were performed during my shift and, if possible, patient and/or family made aware of any abnormal findings. ____________________________________________   PROCEDURES  Procedure(s) performed: None  Procedures  Critical Care performed: None  ____________________________________________   INITIAL IMPRESSION / ASSESSMENT AND PLAN / ED COURSE  Pertinent labs & imaging results that  were available during my care of the patient were reviewed by me and considered in my medical decision making (see chart for details).  Very well-appearing woman with what is likely a viral GI pathology. 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 We will give her IV fluids antiemetics and reassess. Abdomen is benign.  Clinical Course    ____________________________________________   FINAL CLINICAL IMPRESSION(S) / ED DIAGNOSES  Final diagnoses:  None      This chart was dictated using voice recognition software.  Despite best efforts to proofread,  errors can occur which can change meaning.      Jeanmarie PlantJames A Lekeith Wulf, MD 02/26/16 [phone]

## 2016-02-26 NOTE — ED Notes (Signed)
Pt feels better and is ready to go. Informed Dr. Alphonzo LemmingsMcShane

## 2016-02-26 NOTE — ED Notes (Signed)
pt given crackers and ginger ale for po challenge

## 2016-02-26 NOTE — ED Notes (Signed)
Pt tearful about missing work, sat and talked with pt, reassured and more calm. Pt received meds and getting fluids.  Warm blankets given.  Lights dimmed, pt resting.

## 2016-03-27 ENCOUNTER — Encounter: Payer: Self-pay | Admitting: Emergency Medicine

## 2016-03-27 ENCOUNTER — Emergency Department: Payer: Self-pay

## 2016-03-27 DIAGNOSIS — Y9389 Activity, other specified: Secondary | ICD-10-CM | POA: Insufficient documentation

## 2016-03-27 DIAGNOSIS — Y92210 Daycare center as the place of occurrence of the external cause: Secondary | ICD-10-CM | POA: Insufficient documentation

## 2016-03-27 DIAGNOSIS — Y990 Civilian activity done for income or pay: Secondary | ICD-10-CM | POA: Insufficient documentation

## 2016-03-27 DIAGNOSIS — W1800XA Striking against unspecified object with subsequent fall, initial encounter: Secondary | ICD-10-CM | POA: Insufficient documentation

## 2016-03-27 DIAGNOSIS — S8391XA Sprain of unspecified site of right knee, initial encounter: Secondary | ICD-10-CM | POA: Insufficient documentation

## 2016-03-27 DIAGNOSIS — Z79899 Other long term (current) drug therapy: Secondary | ICD-10-CM | POA: Insufficient documentation

## 2016-03-27 DIAGNOSIS — J45909 Unspecified asthma, uncomplicated: Secondary | ICD-10-CM | POA: Insufficient documentation

## 2016-03-27 NOTE — ED Triage Notes (Signed)
Patient ambulatory to triage with steady gait, without difficulty or distress noted; pt reports falling on right knee at work 1/23 and has filed workers comp; st taking tramadol without relief

## 2016-03-28 ENCOUNTER — Emergency Department
Admission: EM | Admit: 2016-03-28 | Discharge: 2016-03-28 | Disposition: A | Discharge status: Home / Self Care | Payer: Self-pay | Attending: Emergency Medicine | Admitting: Emergency Medicine

## 2016-03-28 DIAGNOSIS — S8991XA Unspecified injury of right lower leg, initial encounter: Secondary | ICD-10-CM

## 2016-03-28 DIAGNOSIS — S8391XA Sprain of unspecified site of right knee, initial encounter: Secondary | ICD-10-CM

## 2016-03-28 MED ORDER — IBUPROFEN 800 MG PO TABS: 800.0000 mg | ORAL_TABLET | Freq: Three times a day (TID) | ORAL | 0 refills | Status: DC | PRN

## 2016-03-28 MED ORDER — IBUPROFEN 800 MG PO TABS
800.0000 mg | ORAL_TABLET | Freq: Once | ORAL | Status: AC
  Administered 2016-03-28: 800 mg via ORAL
  Filled 2016-03-28: qty 1

## 2016-03-28 MED ORDER — OXYCODONE-ACETAMINOPHEN 5-325 MG PO TABS: 1.0000 | ORAL_TABLET | ORAL | 0 refills | Status: DC | PRN

## 2016-03-28 MED ORDER — OXYCODONE-ACETAMINOPHEN 5-325 MG PO TABS
1.0000 | ORAL_TABLET | Freq: Once | ORAL | Status: AC
  Administered 2016-03-28: 1 via ORAL
  Filled 2016-03-28: qty 1

## 2016-03-28 NOTE — ED Provider Notes (Signed)
[facility] Emergency Department Provider Note   ____________________________________________   First MD Initiated Contact with Patient 03/28/16 [phone]     (approximate)  I have reviewed the triage vital signs and the nursing notes.   HISTORY  Chief Complaint Knee Injury    HPI Debbie Leon is a 56 y.o. female who presents to the ED from home with a chief complaint of right knee pain. 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 Complains of pain and swelling since. One week ago she was seen at a local urgent care and prescribed tramadol which she has not yet filled. States she has taken it before and it does nothing to relieve her pain. Wearing the brace and icing the area has not been helping either. Denies associated extremity weakness, numbness or tingling. No other associated injuries.   Past Medical History:  Diagnosis Date  . Asthma     There are no active problems to display for this patient.   History reviewed. No pertinent surgical history.  Prior to Admission medications   Medication Sig Start Date End Date Taking? Authorizing Provider  albuterol (PROVENTIL HFA;VENTOLIN HFA) 108 (90 Base) MCG/ACT inhaler Inhale 2 puffs into the lungs every 4 (four) hours as needed for wheezing or shortness of breath. 04/19/15   Delorise Royals Cuthriell, PA-C  amoxicillin (AMOXIL) 500 MG capsule Take 500 mg by mouth.    Historical Provider, MD  brompheniramine-pseudoephedrine-DM 30-2-10 MG/5ML syrup Take 5 mLs by mouth 4 (four) times daily as needed. 12/05/15   Joni Reining, PA-C  cetirizine (ZYRTEC) 10 MG tablet Take 1 tablet (10 mg total) by mouth daily. 09/14/15   Delorise Royals Cuthriell, PA-C  clindamycin (CLEOCIN) 300 MG capsule Take 300 mg by mouth.    Historical Provider, MD  fluticasone (FLONASE) 50 MCG/ACT nasal spray Place 1 spray into both nostrils 2 (two) times daily. 09/14/15   Delorise Royals Cuthriell, PA-C    ibuprofen (ADVIL,MOTRIN) 800 MG tablet Take 1 tablet (800 mg total) by mouth every 8 (eight) hours as needed for moderate pain. 03/28/16   Irean Hong, MD  Norgestim-Eth Charlott Holler Triphasic (ORTHO TRI-CYCLEN LO PO) Take 1 tablet by mouth daily.    Historical Provider, MD  ondansetron (ZOFRAN) 4 MG tablet Take 1 tablet (4 mg total) by mouth every 8 (eight) hours as needed for nausea or vomiting. 02/26/16   Jeanmarie Plant, MD  oxyCODONE-acetaminophen (ROXICET) 5-325 MG tablet Take 1 tablet by mouth every 4 (four) hours as needed for severe pain. 03/28/16   Irean Hong, MD    Allergies Dye fdc red Adele Schilder dye] and Shrimp [shellfish allergy]  No family history on file.  Social History Social History  Substance Use Topics  . Smoking status: Never Smoker  . Smokeless tobacco: Never Used  . Alcohol use No    Review of Systems  Constitutional: No fever/chills. Eyes: No visual changes. ENT: No sore throat. Cardiovascular: Denies chest pain. Respiratory: Denies shortness of breath. Gastrointestinal: No abdominal pain.  No nausea, no vomiting.  No diarrhea.  No constipation. Genitourinary: Negative for dysuria. Musculoskeletal: Positive for right knee pain. Negative for back pain. Skin: Negative for rash. Neurological: Negative for headaches, focal weakness or numbness.  10-point ROS otherwise negative.  ____________________________________________   PHYSICAL EXAM:  VITAL SIGNS: ED Triage Vitals  Enc Vitals Group     BP 03/27/16 2309 (!) 145/67     Pulse Rate 03/27/16  2309 64     Resp 03/27/16 2309 18     Temp 03/27/16 2309 98 F (36.7 C)     Temp Source 03/27/16 2309 Oral     SpO2 03/27/16 2309 100 %     Weight 03/27/16 2302 190 lb (86.2 kg)     Height 03/27/16 2302 5\' 5"  (1.651 m)     Head Circumference --      Peak Flow --      Pain Score 03/27/16 2302 10     Pain Loc --      Pain Edu? --      Excl. in GC? --     Constitutional: Alert and oriented. Well appearing and in no  acute distress. Eyes: Conjunctivae are normal. PERRL. EOMI. Head: Atraumatic. Nose: No congestion/rhinnorhea. Mouth/Throat: Mucous membranes are moist.  Oropharynx non-erythematous. Neck: No stridor.  No cervical spine tenderness to palpation. Cardiovascular: Normal rate, regular rhythm. Grossly normal heart sounds.  Good peripheral circulation. Respiratory: Normal respiratory effort.  No retractions. Lungs CTAB. Gastrointestinal: Soft and nontender. No distention. No abdominal bruits. No CVA tenderness. Musculoskeletal: Right anterior and medial knee tender to palpation with mild joint effusion. Limited range of motion secondary to pain. Calf is supple without evidence for compartment syndrome. 2+ distal pulses. Brisk, less than 5 second capillary refill bilaterally. Symmetrically warm limbs without evidence for ischemia. Neurologic:  Normal speech and language. No gross focal neurologic deficits are appreciated.  Skin:  Skin is warm, dry and intact. No rash noted. Psychiatric: Mood and affect are normal. Speech and behavior are normal.  ____________________________________________   LABS (all labs ordered are listed, but only abnormal results are displayed)  Labs Reviewed - No data to display ____________________________________________  EKG  None ____________________________________________  RADIOLOGY  Right knee x-rays interpreted per Dr. Andria MeuseStevens: Mild degenerative changes in the right knee. Small effusion. No  evidence of acute fracture or dislocation.   ____________________________________________   PROCEDURES  Procedure(s) performed: None  Procedures  Critical Care performed: No  ____________________________________________   INITIAL IMPRESSION / ASSESSMENT AND PLAN / ED COURSE  Pertinent labs & imaging results that were available during my care of the patient were reviewed by me and considered in my medical decision making (see chart for  details).  56 year old female status post right knee injury 2 weeks ago with persistent pain and swelling. Will provide limited prescription of analgesics, recommend RICE and orthopedics follow-up. Strict return precautions given. Patient verbalizes understanding and agrees with plan of care.      ____________________________________________   FINAL CLINICAL IMPRESSION(S) / ED DIAGNOSES  Final diagnoses:  Injury of right knee, initial encounter  Sprain of right knee, unspecified ligament, initial encounter      NEW MEDICATIONS STARTED DURING THIS VISIT:  New Prescriptions   IBUPROFEN (ADVIL,MOTRIN) 800 MG TABLET    Take 1 tablet (800 mg total) by mouth every 8 (eight) hours as needed for moderate pain.   OXYCODONE-ACETAMINOPHEN (ROXICET) 5-325 MG TABLET    Take 1 tablet by mouth every 4 (four) hours as needed for severe pain.     Note:  This document was prepared using Dragon voice recognition software and may include unintentional dictation errors.    Irean HongJade J Ferris Tally, MD 03/28/16 [phone]

## 2016-03-28 NOTE — Discharge Instructions (Signed)
1. You may take pain medicines as needed (Motrin/Percocet). 2. Continue using knee brace, elevating leg and applying ice several times daily. 3. Return to the ER for worsening symptoms, increased swelling, extremity weakness or other concerns.

## 2016-07-20 ENCOUNTER — Encounter (HOSPITAL_COMMUNITY): Payer: Self-pay

## 2016-07-20 ENCOUNTER — Emergency Department (HOSPITAL_COMMUNITY)
Admission: EM | Admit: 2016-07-20 | Discharge: 2016-07-20 | Disposition: A | Discharge status: Home Health | Payer: Managed Care, Other (non HMO) | Attending: Emergency Medicine | Admitting: Emergency Medicine

## 2016-07-20 DIAGNOSIS — K0889 Other specified disorders of teeth and supporting structures: Secondary | ICD-10-CM | POA: Diagnosis not present

## 2016-07-20 DIAGNOSIS — J45909 Unspecified asthma, uncomplicated: Secondary | ICD-10-CM | POA: Insufficient documentation

## 2016-07-20 MED ORDER — ONDANSETRON HCL 4 MG PO TABS
4.0000 mg | ORAL_TABLET | Freq: Once | ORAL | Status: AC
  Administered 2016-07-20: 4 mg via ORAL
  Filled 2016-07-20: qty 1

## 2016-07-20 MED ORDER — ACETAMINOPHEN-CODEINE #3 300-30 MG PO TABS: 1.0000 | ORAL_TABLET | Freq: Four times a day (QID) | ORAL | 0 refills | Status: DC | PRN

## 2016-07-20 MED ORDER — CLINDAMYCIN HCL 300 MG PO CAPS: 300.0000 mg | ORAL_CAPSULE | Freq: Three times a day (TID) | ORAL | 0 refills | Status: DC

## 2016-07-20 MED ORDER — CLINDAMYCIN HCL 150 MG PO CAPS
300.0000 mg | ORAL_CAPSULE | Freq: Once | ORAL | Status: AC
  Administered 2016-07-20: 300 mg via ORAL
  Filled 2016-07-20: qty 2

## 2016-07-20 NOTE — ED Provider Notes (Signed)
[facility]-EMERGENCY DEPT Provider Note   CSN: [id number] Arrival date & time: 07/20/16  1215     History   Chief Complaint Chief Complaint  Patient presents with  . Dental Pain    HPI Debbie Leon is a 56 y.o. female.  Patient is a 56 year old female who presents to the emergency department with a complaint of dental pain. Patient states that in November 2017 she had a procedure in which a portion of a large root of one of her teeth was removed. She continued to have problems off and on, was reevaluated and on Tuesday the area was reopened and there was a "hole in the previous site with black eschar". According to the patient the area was scraped. And the patient was placed on antibiotic therapy. The patient states that she is having increasing pain and she feels as though there is some swelling present. She would like to be reevaluated. And be considered for different antibiotic. She has not measured temperature elevation. She's not had vomiting. She's not had any neck stiffness. She has had some facial soreness.    The history is provided by the patient.  Dental Pain   This is a new problem.    Past Medical History:  Diagnosis Date  . Asthma     There are no active problems to display for this patient.   Past Surgical History:  Procedure Laterality Date  . DENTAL SURGERY      OB History    No data available       Home Medications    Prior to Admission medications   Medication Sig Start Date End Date Taking? Authorizing Provider  albuterol (PROVENTIL HFA;VENTOLIN HFA) 108 (90 Base) MCG/ACT inhaler Inhale 2 puffs into the lungs every 4 (four) hours as needed for wheezing or shortness of breath. 04/19/15   Cuthriell, Delorise RoyalsJonathan D, PA-C  amoxicillin (AMOXIL) 500 MG capsule Take 500 mg by mouth.    [provider]  brompheniramine-pseudoephedrine-DM 30-2-10 MG/5ML syrup Take 5 mLs by mouth 4 (four) times daily as needed. 12/05/15   Joni ReiningSmith, Ronald K, PA-C  cetirizine  (ZYRTEC) 10 MG tablet Take 1 tablet (10 mg total) by mouth daily. 09/14/15   Cuthriell, Delorise RoyalsJonathan D, PA-C  clindamycin (CLEOCIN) 300 MG capsule Take 300 mg by mouth.    [provider]  fluticasone (FLONASE) 50 MCG/ACT nasal spray Place 1 spray into both nostrils 2 (two) times daily. 09/14/15   Cuthriell, Delorise RoyalsJonathan D, PA-C  ibuprofen (ADVIL,MOTRIN) 800 MG tablet Take 1 tablet (800 mg total) by mouth every 8 (eight) hours as needed for moderate pain. 03/28/16   Irean HongSung, Jade J, MD  Norgestim-Eth Charlott HollerEstrad Triphasic (ORTHO TRI-CYCLEN LO PO) Take 1 tablet by mouth daily.    [provider]  ondansetron (ZOFRAN) 4 MG tablet Take 1 tablet (4 mg total) by mouth every 8 (eight) hours as needed for nausea or vomiting. 02/26/16   Jeanmarie PlantMcShane, James A, MD  oxyCODONE-acetaminophen (ROXICET) 5-325 MG tablet Take 1 tablet by mouth every 4 (four) hours as needed for severe pain. 03/28/16   Irean HongSung, Jade J, MD    Family History No family history on file.  Social History Social History  Substance Use Topics  . Smoking status: Never Smoker  . Smokeless tobacco: Never Used  . Alcohol use No     Allergies   Dye fdc red [red dye] and Shrimp [shellfish allergy]   Review of Systems Review of Systems  Constitutional: Negative for activity change and appetite  change.  HENT: Positive for dental problem and facial swelling. Negative for congestion, ear discharge, ear pain, nosebleeds, rhinorrhea, sneezing and tinnitus.   Eyes: Negative for photophobia, pain and discharge.  Respiratory: Negative for cough, choking, shortness of breath and wheezing.   Cardiovascular: Negative for chest pain, palpitations and leg swelling.  Gastrointestinal: Negative for abdominal pain, blood in stool, constipation, diarrhea, nausea and vomiting.  Genitourinary: Negative for difficulty urinating, dysuria, flank pain, frequency and hematuria.  Musculoskeletal: Negative for back pain, gait problem, myalgias and neck pain.  Skin:  Negative for color change, rash and wound.  Neurological: Negative for dizziness, seizures, syncope, facial asymmetry, speech difficulty, weakness and numbness.  Hematological: Negative for adenopathy. Does not bruise/bleed easily.  Psychiatric/Behavioral: Negative for agitation, confusion, hallucinations, self-injury and suicidal ideas. The patient is not nervous/anxious.      Physical Exam Updated Vital Signs BP 135/76 (BP Location: Left Arm)   Pulse 77   Temp 98.3 F (36.8 C) (Oral)   Resp 18   Ht 5\' 5"  (1.651 m)   Wt 86.2 kg (190 lb)   SpO2 98%   BMI 31.62 kg/m   Physical Exam  Constitutional: Vital signs are normal. She appears well-developed and well-nourished. She is active.  HENT:  Head: Normocephalic and atraumatic.  Right Ear: Tympanic membrane, external ear and ear canal normal.  Left Ear: Tympanic membrane, external ear and ear canal normal.  Nose: Nose normal.  Mouth/Throat: Uvula is midline, oropharynx is clear and moist and mucous membranes are normal.  Surgical sutures remain in place. There is no drainage from the gums. There is some swelling appreciated. No visible abscess appreciated. The area is tender to touch.   Eyes: Conjunctivae, EOM and lids are normal. Pupils are equal, round, and reactive to light.  Neck: Trachea normal, normal range of motion and phonation normal. Neck supple. Carotid bruit is not present.  Cardiovascular: Normal rate, regular rhythm and normal pulses.   Abdominal: Soft. Normal appearance and bowel sounds are normal.  Lymphadenopathy:       Head (right side): No submental, no preauricular and no posterior auricular adenopathy present.       Head (left side): No submental, no preauricular and no posterior auricular adenopathy present.    She has cervical adenopathy.  Neurological: She is alert. She has normal strength. No cranial nerve deficit or sensory deficit. GCS eye subscore is 4. GCS verbal subscore is 5. GCS motor subscore is 6.    Skin: Skin is warm and dry.  Psychiatric: Her speech is normal.     ED Treatments / Results  Labs (all labs ordered are listed, but only abnormal results are displayed) Labs Reviewed - No data to display  EKG  EKG Interpretation None       Radiology No results found.  Procedures Procedures (including critical care time)  Medications Ordered in ED Medications - No data to display   Initial Impression / Assessment and Plan / ED Course  I have reviewed the triage vital signs and the nursing notes.  Pertinent labs & imaging results that were available during my care of the patient were reviewed by me and considered in my medical decision making (see chart for details).      Final Clinical Impressions(s) / ED Diagnoses MDM Vital signs within normal limits. No visible abscess appreciated this time the surgical site appears to be intact. We'll change the patient to clindamycin. And give her a few tablets of Tylenol codeine to use for  pain. I've asked patient to see her dentist for reevaluation as soon as possible. Patient is in agreement with this plan.    Final diagnoses:  Pain, dental    New Prescriptions Discharge Medication List as of 07/20/2016  2:06 PM    START taking these medications   Details  acetaminophen-codeine (TYLENOL #3) 300-30 MG tablet Take 1-2 tablets by mouth every 6 (six) hours as needed for moderate pain., Starting Sat 07/20/2016, Print         Ivery Quale, PA-C 07/23/16 [id number]    Bethann Berkshire, MD 07/24/16 [id number]

## 2016-07-20 NOTE — Discharge Instructions (Signed)
Please contact your dentist on Monday and discuss the symptoms that you've been experiencing. Please stop the Amoxil for now, and use clindamycin 3 times daily with a meal. Use 600 mg of ibuprofen with breakfast, lunch, dinner, and at bedtime. Use Tylenol codeine for more severe pain on. This medication may cause drowsiness, please do not drive a vehicle, operating machinery, drink alcohol, or participate in activities requiring concentration when taking this medication. Please take this medication with a meal.

## 2016-07-20 NOTE — ED Triage Notes (Signed)
Pt had oral surgery on Tuesday. Is complaining of right lower mouth pain, pain on right side of face and in ear.

## 2016-08-08 ENCOUNTER — Emergency Department
Admission: EM | Admit: 2016-08-08 | Discharge: 2016-08-08 | Disposition: A | Discharge status: Home / Self Care | Payer: Managed Care, Other (non HMO) | Attending: Emergency Medicine | Admitting: Emergency Medicine

## 2016-08-08 ENCOUNTER — Encounter: Payer: Self-pay | Admitting: Emergency Medicine

## 2016-08-08 ENCOUNTER — Emergency Department: Payer: Managed Care, Other (non HMO)

## 2016-08-08 DIAGNOSIS — J45909 Unspecified asthma, uncomplicated: Secondary | ICD-10-CM | POA: Insufficient documentation

## 2016-08-08 DIAGNOSIS — Z793 Long term (current) use of hormonal contraceptives: Secondary | ICD-10-CM | POA: Insufficient documentation

## 2016-08-08 DIAGNOSIS — R200 Anesthesia of skin: Secondary | ICD-10-CM | POA: Insufficient documentation

## 2016-08-08 DIAGNOSIS — N3000 Acute cystitis without hematuria: Secondary | ICD-10-CM | POA: Insufficient documentation

## 2016-08-08 DIAGNOSIS — Z79899 Other long term (current) drug therapy: Secondary | ICD-10-CM | POA: Insufficient documentation

## 2016-08-08 LAB — URINALYSIS, COMPLETE (UACMP) WITH MICROSCOPIC
Bilirubin Urine: NEGATIVE
Glucose, UA: NEGATIVE mg/dL
Ketones, ur: NEGATIVE mg/dL
Nitrite: NEGATIVE
PH: 5 (ref 5.0–8.0)
Protein, ur: NEGATIVE mg/dL
SPECIFIC GRAVITY, URINE: 1.015 (ref 1.005–1.030)

## 2016-08-08 LAB — BASIC METABOLIC PANEL
Anion gap: 4 — ABNORMAL LOW (ref 5–15)
BUN: 33 mg/dL — AB (ref 6–20)
CHLORIDE: 106 mmol/L (ref 101–111)
CO2: 29 mmol/L (ref 22–32)
Calcium: 8.1 mg/dL — ABNORMAL LOW (ref 8.9–10.3)
Creatinine, Ser: 0.96 mg/dL (ref 0.44–1.00)
GFR calc Af Amer: >60 mL/min (ref >60)
GFR calc non Af Amer: >60 mL/min (ref >60)
GLUCOSE: 117 mg/dL — AB (ref 65–99)
Potassium: 4.1 mmol/L (ref 3.5–5.1)
SODIUM: 139 mmol/L (ref 135–145)

## 2016-08-08 LAB — CBC
HEMATOCRIT: 41.4 % (ref 35.0–47.0)
Hemoglobin: 13.6 g/dL (ref 12.0–16.0)
MCH: 29.6 pg (ref 26.0–34.0)
MCHC: 33 g/dL (ref 32.0–36.0)
MCV: 89.9 fL (ref 80.0–100.0)
Platelets: 165 10*3/uL (ref 150–440)
RBC: 4.6 MIL/uL (ref 3.80–5.20)
RDW: 14.5 % (ref 11.5–14.5)
WBC: 14.7 10*3/uL — AB (ref 3.6–11.0)

## 2016-08-08 LAB — TROPONIN I: Troponin I: <0.03 ng/mL (ref <0.03)

## 2016-08-08 MED ORDER — CIPROFLOXACIN HCL 500 MG PO TABS: 500.0000 mg | ORAL_TABLET | Freq: Two times a day (BID) | ORAL | 0 refills | Status: AC

## 2016-08-08 MED ORDER — CIPROFLOXACIN HCL 500 MG PO TABS
500.0000 mg | ORAL_TABLET | Freq: Once | ORAL | Status: AC
  Administered 2016-08-08: 500 mg via ORAL
  Filled 2016-08-08: qty 1

## 2016-08-08 NOTE — ED Notes (Signed)
Patient transported to CT 

## 2016-08-08 NOTE — ED Triage Notes (Signed)
Patient ambulatory to triage with steady gait, without difficulty or distress noted; pt reports awakening with "heaviness/numbness" to right arm that lasted approx ; denies any hx of such, denies symptoms at present

## 2016-08-08 NOTE — ED Notes (Signed)
Patient denies pain and is resting comfortably.  

## 2016-08-08 NOTE — ED Provider Notes (Signed)
[facility] Emergency Department Provider Note    First MD Initiated Contact with Patient 08/08/16 [id number]     (approximate)  I have reviewed the triage vital signs and the nursing notes.   HISTORY  Chief Complaint Numbness    HPI Debbie Leon is a 56 y.o. female with history of asthma presents to the emergency department with three-day history of dysuria consistent with previous urinary tract infection. Patient denies any fever no back pain. Patient denies any nausea or vomiting. Patient also states that tonight when she awoke her right arm was numb and weak last approximately 7 minutes followed by complete resolution. Patient denied any headache no nausea vomiting visual changes or gait instability at bedtime.   Past Medical History:  Diagnosis Date  . Asthma     There are no active problems to display for this patient.   Past Surgical History:  Procedure Laterality Date  . DENTAL SURGERY      Prior to Admission medications   Medication Sig Start Date End Date Taking? Authorizing Provider  acetaminophen-codeine (TYLENOL #3) 300-30 MG tablet Take 1-2 tablets by mouth every 6 (six) hours as needed for moderate pain. 07/20/16   Ivery QualeBryant, Hobson, PA-C  albuterol (PROVENTIL HFA;VENTOLIN HFA) 108 (90 Base) MCG/ACT inhaler Inhale 2 puffs into the lungs every 4 (four) hours as needed for wheezing or shortness of breath. 04/19/15   Cuthriell, Delorise RoyalsJonathan D, PA-C  amoxicillin (AMOXIL) 500 MG capsule Take 500 mg by mouth.    [provider]  brompheniramine-pseudoephedrine-DM 30-2-10 MG/5ML syrup Take 5 mLs by mouth 4 (four) times daily as needed. 12/05/15   Joni ReiningSmith, Ronald K, PA-C  cetirizine (ZYRTEC) 10 MG tablet Take 1 tablet (10 mg total) by mouth daily. 09/14/15   Cuthriell, Delorise RoyalsJonathan D, PA-C  ciprofloxacin (CIPRO) 500 MG tablet Take 1 tablet (500 mg total) by mouth 2 (two) times daily. 08/08/16 08/15/16  Darci CurrentBrown, Enville N, MD  clindamycin (CLEOCIN) 300 MG  capsule Take 1 capsule (300 mg total) by mouth 3 (three) times daily. 07/20/16   Ivery QualeBryant, Hobson, PA-C  fluticasone (FLONASE) 50 MCG/ACT nasal spray Place 1 spray into both nostrils 2 (two) times daily. 09/14/15   Cuthriell, Delorise RoyalsJonathan D, PA-C  ibuprofen (ADVIL,MOTRIN) 800 MG tablet Take 1 tablet (800 mg total) by mouth every 8 (eight) hours as needed for moderate pain. 03/28/16   Irean HongSung, Jade J, MD  Norgestim-Eth Charlott HollerEstrad Triphasic (ORTHO TRI-CYCLEN LO PO) Take 1 tablet by mouth daily.    [provider]  ondansetron (ZOFRAN) 4 MG tablet Take 1 tablet (4 mg total) by mouth every 8 (eight) hours as needed for nausea or vomiting. 02/26/16   Jeanmarie PlantMcShane, James A, MD  oxyCODONE-acetaminophen (ROXICET) 5-325 MG tablet Take 1 tablet by mouth every 4 (four) hours as needed for severe pain. 03/28/16   Irean HongSung, Jade J, MD    Allergies Dye fdc red Adele Schilder[red dye] and Shrimp [shellfish allergy]  No family history on file.  Social History Social History  Substance Use Topics  . Smoking status: Never Smoker  . Smokeless tobacco: Never Used  . Alcohol use No    Review of Systems Constitutional: No fever/chills Eyes: No visual changes. ENT: No sore throat. Cardiovascular: Denies chest pain. Respiratory: Denies shortness of breath. Gastrointestinal: No abdominal pain.  No nausea, no vomiting.  No diarrhea.  No constipation. Genitourinary: Positive for dysuria. Musculoskeletal: Negative for neck pain.  Negative for back pain. Integumentary: Negative for rash. Neurological: Negative for headaches, focal weakness  or numbness.   ____________________________________________   PHYSICAL EXAM:  VITAL SIGNS: ED Triage Vitals  Enc Vitals Group     BP 08/08/16 0507 133/73     Pulse Rate 08/08/16 0507 66     Resp 08/08/16 0507 20     Temp 08/08/16 0507 97.6 F (36.4 C)     Temp Source 08/08/16 0507 Oral     SpO2 08/08/16 0507 99 %     Weight 08/08/16 0504 86.2 kg (190 lb)     Height 08/08/16 0504 1.651 m (5\' 5" )       Head Circumference --      Peak Flow --      Pain Score --      Pain Loc --      Pain Edu? --      Excl. in GC? --     Constitutional: Alert and oriented. Well appearing and in no acute distress. Eyes: Conjunctivae are normal. PERRL. EOMI. Head: Atraumatic. Ears:  Healthy appearing ear canals and TMs bilaterally Nose: No congestion/rhinnorhea. Mouth/Throat: Mucous membranes are moist.  Oropharynx non-erythematous. Neck: No stridor.  No meningeal signs.  No cervical spine tenderness to palpation. Cardiovascular: Normal rate, regular rhythm. Good peripheral circulation. Grossly normal heart sounds. Respiratory: Normal respiratory effort.  No retractions. Lungs CTAB. Gastrointestinal: Soft and nontender. No distention.  Musculoskeletal: No lower extremity tenderness nor edema. No gross deformities of extremities. Neurologic:  Normal speech and language. No gross focal neurologic deficits are appreciated.  Skin:  Skin is warm, dry and intact. No rash noted. Psychiatric: Mood and affect are normal. Speech and behavior are normal.  ____________________________________________   LABS (all labs ordered are listed, but only abnormal results are displayed)  Labs Reviewed  BASIC METABOLIC PANEL - Abnormal; Notable for the following:       Result Value   Glucose, Bld 117 (*)    BUN 33 (*)    Calcium 8.1 (*)    Anion gap 4 (*)    All other components within normal limits  CBC - Abnormal; Notable for the following:    WBC 14.7 (*)    All other components within normal limits  URINALYSIS, COMPLETE (UACMP) WITH MICROSCOPIC - Abnormal; Notable for the following:    APPearance HAZY (*)    Hgb urine dipstick MODERATE (*)    Leukocytes, UA SMALL (*)    Squamous Epithelial / LPF 0-5 (*)    Bacteria, UA MANY (*)    All other components within normal limits  TROPONIN I  CBG MONITORING, ED   ____________________________________________  EKG  ED ECG REPORT I, [place] N Tynleigh Birt, the  attending physician, personally viewed and interpreted this ECG.   Date: 08/08/2016  EKG Time: 5:22 AM  Rate: 69  Rhythm: Normal sinus rhythm  Axis: Normal  Intervals: Normal  ST&T Change: None  ____________________________________________  RADIOLOGY I, Winton N Cambrey Lupi, personally viewed and evaluated these images (plain radiographs) as part of my medical decision making, as well as reviewing the written report by the radiologist.  Ct Head Wo Contrast  Result Date: 08/08/2016 CLINICAL DATA:  Right arm numbness. EXAM: CT HEAD WITHOUT CONTRAST TECHNIQUE: Contiguous axial images were obtained from the base of the skull through the vertex without intravenous contrast. COMPARISON:  None. FINDINGS: Brain: No evidence of acute infarction, hemorrhage, hydrocephalus, extra-axial collection or mass lesion/mass effect. Punctate basal gangliar calcifications. Vascular: No hyperdense vessel or unexpected calcification. Skull: Normal. Negative for fracture or focal lesion. Sinuses/Orbits: Minimal mucosal thickening in  the sphenoid sinus. Paranasal sinuses are otherwise clear. Mastoid air cells are clear. Visualized orbits are normal. Other: None. IMPRESSION: No acute intracranial abnormality. Electronically Signed   By: Rubye Oaks M.D.   On: 08/08/2016 05:42      Procedures   ____________________________________________   INITIAL IMPRESSION / ASSESSMENT AND PLAN / ED COURSE  Pertinent labs & imaging results that were available during my care of the patient were reviewed by me and considered in my medical decision making (see chart for details).  Patient given Cipro 500 mg will prescribe same for home. Suspect brachial nerve compression during sleep as etiology for the patient's right arm numbness weakness that resolved. CT head normal      ____________________________________________  FINAL CLINICAL IMPRESSION(S) / ED DIAGNOSES  Final diagnoses:  Acute cystitis without hematuria       MEDICATIONS GIVEN DURING THIS VISIT:  Medications  ciprofloxacin (CIPRO) tablet 500 mg (500 mg Oral Given 08/08/16 0636)     NEW OUTPATIENT MEDICATIONS STARTED DURING THIS VISIT:  Discharge Medication List as of 08/08/2016  6:34 AM    START taking these medications   Details  ciprofloxacin (CIPRO) 500 MG tablet Take 1 tablet (500 mg total) by mouth 2 (two) times daily., Starting Thu 08/08/2016, Until Thu 08/15/2016, Print        Discharge Medication List as of 08/08/2016  6:34 AM      Discharge Medication List as of 08/08/2016  6:34 AM       Note:  This document was prepared using Dragon voice recognition software and may include unintentional dictation errors.    Darci Current, MD 08/08/16 [phone]

## 2016-10-25 ENCOUNTER — Emergency Department
Admission: EM | Admit: 2016-10-25 | Discharge: 2016-10-25 | Disposition: A | Discharge status: Home / Self Care | Payer: Managed Care, Other (non HMO) | Attending: Emergency Medicine | Admitting: Emergency Medicine

## 2016-10-25 DIAGNOSIS — J45909 Unspecified asthma, uncomplicated: Secondary | ICD-10-CM | POA: Insufficient documentation

## 2016-10-25 DIAGNOSIS — K0889 Other specified disorders of teeth and supporting structures: Secondary | ICD-10-CM | POA: Insufficient documentation

## 2016-10-25 DIAGNOSIS — W57XXXA Bitten or stung by nonvenomous insect and other nonvenomous arthropods, initial encounter: Secondary | ICD-10-CM | POA: Insufficient documentation

## 2016-10-25 DIAGNOSIS — Z79899 Other long term (current) drug therapy: Secondary | ICD-10-CM | POA: Insufficient documentation

## 2016-10-25 MED ORDER — PENICILLIN V POTASSIUM 500 MG PO TABS: 500.0000 mg | ORAL_TABLET | Freq: Four times a day (QID) | ORAL | 0 refills | Status: DC

## 2016-10-25 MED ORDER — TRAMADOL HCL 50 MG PO TABS: 50.0000 mg | ORAL_TABLET | Freq: Four times a day (QID) | ORAL | 0 refills | Status: DC | PRN

## 2016-10-25 MED ORDER — PENICILLIN V POTASSIUM 500 MG PO TABS
500.0000 mg | ORAL_TABLET | Freq: Once | ORAL | Status: AC
  Administered 2016-10-25: 500 mg via ORAL
  Filled 2016-10-25: qty 1

## 2016-10-25 MED ORDER — IBUPROFEN 600 MG PO TABS: 600.0000 mg | ORAL_TABLET | Freq: Four times a day (QID) | ORAL | 0 refills | Status: DC | PRN

## 2016-10-25 NOTE — ED Provider Notes (Signed)
[facility]-EMERGENCY DEPARTMENT Provider Note   CSN: [id number] Arrival date & time: 10/25/16  2105     History   Chief Complaint Chief Complaint  Patient presents with  . Dental Pain  . Insect Bite    HPI Debbie Leon is a 56 y.o. female presents to the emergency department for evaluation of insect bite to the right lateral calf, but isn't present for 2 weeks. She is unsure what bit her but she has noticed a red swollen area to the lateral calf that has decreased in size, pain, redness as well as hardness of the last couple weeks. She wants this checked due to continued itching. She denies any systemic symptoms such as rash, body aches, fatigue, muscle aches, headaches, neck pain, abdominal pain nausea or vomiting. She states she has been applying topical Benadryl and overall symptoms have been improving to the right lateral calf.  Patient also complaining of dental pain 2 weeks, has a history of dental infections last infection 1 year ago. Patient complains of right lower dental pain. She describes the pain as throbbing. No drainage. No trauma or injury. She denies any fevers, difficulty swallowing or chewing. She has been taking ibuprofen with mild improvement. HPI  Past Medical History:  Diagnosis Date  . Asthma     There are no active problems to display for this patient.   Past Surgical History:  Procedure Laterality Date  . DENTAL SURGERY      OB History    No data available       Home Medications    Prior to Admission medications   Medication Sig Start Date End Date Taking? Authorizing Provider  acetaminophen-codeine (TYLENOL #3) 300-30 MG tablet Take 1-2 tablets by mouth every 6 (six) hours as needed for moderate pain. 07/20/16   Ivery QualeBryant, Hobson, PA-C  albuterol (PROVENTIL HFA;VENTOLIN HFA) 108 (90 Base) MCG/ACT inhaler Inhale 2 puffs into the lungs every 4 (four) hours as needed for wheezing or shortness of breath. 04/19/15   Cuthriell, Delorise RoyalsJonathan D, PA-C  amoxicillin  (AMOXIL) 500 MG capsule Take 500 mg by mouth.    [provider]  brompheniramine-pseudoephedrine-DM 30-2-10 MG/5ML syrup Take 5 mLs by mouth 4 (four) times daily as needed. 12/05/15   Joni ReiningSmith, Ronald K, PA-C  cetirizine (ZYRTEC) 10 MG tablet Take 1 tablet (10 mg total) by mouth daily. 09/14/15   Cuthriell, Delorise RoyalsJonathan D, PA-C  clindamycin (CLEOCIN) 300 MG capsule Take 1 capsule (300 mg total) by mouth 3 (three) times daily. 07/20/16   Ivery QualeBryant, Hobson, PA-C  fluticasone (FLONASE) 50 MCG/ACT nasal spray Place 1 spray into both nostrils 2 (two) times daily. 09/14/15   Cuthriell, Delorise RoyalsJonathan D, PA-C  ibuprofen (ADVIL,MOTRIN) 600 MG tablet Take 1 tablet (600 mg total) by mouth every 6 (six) hours as needed for moderate pain. 10/25/16   Evon SlackGaines, Sakai Heinle C, PA-C  Norgestim-Eth Estrad Triphasic (ORTHO TRI-CYCLEN LO PO) Take 1 tablet by mouth daily.    [provider]  ondansetron (ZOFRAN) 4 MG tablet Take 1 tablet (4 mg total) by mouth every 8 (eight) hours as needed for nausea or vomiting. 02/26/16   Jeanmarie PlantMcShane, James A, MD  oxyCODONE-acetaminophen (ROXICET) 5-325 MG tablet Take 1 tablet by mouth every 4 (four) hours as needed for severe pain. 03/28/16   Irean HongSung, Jade J, MD  penicillin v potassium (VEETID) 500 MG tablet Take 1 tablet (500 mg total) by mouth 4 (four) times daily. 10/25/16   Evon SlackGaines, Aizley Stenseth C, PA-C  traMADol (ULTRAM) 50 MG tablet Take  1 tablet (50 mg total) by mouth every 6 (six) hours as needed. 10/25/16   Evon Slack, PA-C    Family History No family history on file.  Social History Social History  Substance Use Topics  . Smoking status: Never Smoker  . Smokeless tobacco: Never Used  . Alcohol use No     Allergies   Dye fdc red [red dye] and Shrimp [shellfish allergy]   Review of Systems Review of Systems  Constitutional: Negative for activity change, chills, fatigue and fever.  HENT: Positive for dental problem. Negative for sinus pressure and sore throat.   Eyes: Negative for  visual disturbance.  Respiratory: Negative for cough and shortness of breath.   Cardiovascular: Negative for chest pain and leg swelling.  Gastrointestinal: Negative for abdominal pain, diarrhea, nausea and vomiting.  Genitourinary: Negative for dysuria.  Musculoskeletal: Negative for arthralgias and gait problem.  Skin: Positive for wound (insect bite right leg, redness size improving). Negative for rash.  Neurological: Negative for weakness, numbness and headaches.  Hematological: Negative for adenopathy.  Psychiatric/Behavioral: Negative for agitation, behavioral problems and confusion.     Physical Exam Updated Vital Signs BP 121/63   Pulse 74   Temp 98 F (36.7 C) (Oral)   Resp 18   Ht  (1.676 m)   Wt 87.1 kg (192 lb)   SpO2 97%   BMI 30.99 kg/m   Physical Exam  Constitutional: She is oriented to person, place, and time. She appears well-developed and well-nourished. No distress.  HENT:  Head: Normocephalic and atraumatic.  Right Ear: External ear normal.  Left Ear: External ear normal.  Nose: Nose normal.  Mouth/Throat: Oropharynx is clear and moist. No oropharyngeal exudate.    Eyes: Pupils are equal, round, and reactive to light. EOM are normal. Right eye exhibits no discharge. Left eye exhibits no discharge.  Neck: Normal range of motion. Neck supple.  Cardiovascular: Normal rate, regular rhythm and intact distal pulses.   Pulmonary/Chest: Effort normal and breath sounds normal. No respiratory distress. She exhibits no tenderness.  Abdominal: Soft. She exhibits no distension. There is no tenderness.  Musculoskeletal: Normal range of motion. She exhibits no edema or tenderness.  No TMJ tenderness bilaterally. Negative Homans sign bilaterally.  Neurological: She is alert and oriented to person, place, and time. She has normal reflexes.  Skin: Skin is warm and dry.  1.5 x 2 cm erythema lateral calf, with no flunctuance or induration. No calf swelling.    Psychiatric: She has a normal mood and affect. Her behavior is normal. Thought content normal.     ED Treatments / Results  Labs (all labs ordered are listed, but only abnormal results are displayed) Labs Reviewed - No data to display  EKG  EKG Interpretation None       Radiology No results found.  Procedures Procedures (including critical care time)  Medications Ordered in ED Medications  penicillin v potassium (VEETID) tablet 500 mg (not administered)     Initial Impression / Assessment and Plan / ED Course  I have reviewed the triage vital signs and the nursing notes.  Pertinent labs & imaging results that were available during my care of the patient were reviewed by me and considered in my medical decision making (see chart for details).     56 year old female with insect bite to the right leg has been improving over the last 2 weeks. No signs of cellulitis, no erythema migrans lesion. No sign of systemic tic disease symptoms.  Negative Homans sign bilaterally. Recommend continue cortisone treatment and Benadryl cream to the insect bite. Patient with dental pain for 2 weeks, no sign of abscess. Vital signs are stable. We'll start penicillin VK, ibuprofen, tramadol as needed. She will follow up with dental clinic. She is educated on signs and symptoms return to ED for.  Final Clinical Impressions(s) / ED Diagnoses   Final diagnoses:  Pain, dental  Insect bite, initial encounter    New Prescriptions New Prescriptions   IBUPROFEN (ADVIL,MOTRIN) 600 MG TABLET    Take 1 tablet (600 mg total) by mouth every 6 (six) hours as needed for moderate pain.   PENICILLIN V POTASSIUM (VEETID) 500 MG TABLET    Take 1 tablet (500 mg total) by mouth 4 (four) times daily.   TRAMADOL (ULTRAM) 50 MG TABLET    Take 1 tablet (50 mg total) by mouth every 6 (six) hours as needed.     Evon Slack, PA-C 10/25/16 2258    Sharman Cheek, MD 10/28/16 [phone]

## 2016-10-25 NOTE — Discharge Instructions (Signed)
Please use topical hydrocortisone cream and/or Benadryl cream as needed for itching to the right leg. Please take antibiotics and ibuprofen as needed for dental pain. Follow-up with dental clinic. Return to ER for any increasing pain, swelling, fevers or any worsening symptoms or new changes in her health.

## 2016-10-25 NOTE — ED Triage Notes (Signed)
Pt states that she has been having gum/dental pain x 1 week.  Pt also complaining of pain to L lower leg from an insect bite.  Pt is A&Ox4, in NAD, ambulatory to triage.  Pt states she is already on workers comp for L knee.

## 2016-12-23 ENCOUNTER — Emergency Department: Payer: Self-pay

## 2016-12-23 ENCOUNTER — Other Ambulatory Visit: Payer: Self-pay

## 2016-12-23 ENCOUNTER — Emergency Department
Admission: EM | Admit: 2016-12-23 | Discharge: 2016-12-23 | Disposition: A | Discharge status: Home / Self Care | Payer: Self-pay | Attending: Emergency Medicine | Admitting: Emergency Medicine

## 2016-12-23 ENCOUNTER — Encounter: Payer: Self-pay | Admitting: Emergency Medicine

## 2016-12-23 DIAGNOSIS — Z79899 Other long term (current) drug therapy: Secondary | ICD-10-CM | POA: Insufficient documentation

## 2016-12-23 DIAGNOSIS — M25571 Pain in right ankle and joints of right foot: Secondary | ICD-10-CM | POA: Insufficient documentation

## 2016-12-23 DIAGNOSIS — J45909 Unspecified asthma, uncomplicated: Secondary | ICD-10-CM | POA: Insufficient documentation

## 2016-12-23 MED ORDER — IBUPROFEN 600 MG PO TABS: 600.0000 mg | ORAL_TABLET | Freq: Four times a day (QID) | ORAL | 0 refills | Status: AC | PRN

## 2016-12-23 NOTE — ED Notes (Signed)
Patient declined discharge vital signs. 

## 2016-12-23 NOTE — ED Triage Notes (Signed)
[facility]. Pt states has right ankle and foot pain. Pt ambulatory to triage. No apparent distress noted.

## 2016-12-23 NOTE — ED Provider Notes (Signed)
[facility] Emergency Department Provider Note  ____________________________________________  Time seen: Approximately 2:04 PM  I have reviewed the triage vital signs and the nursing notes.   HISTORY  Chief Complaint Ankle Pain    HPI Debbie Leon is a 56 y.o. female that presents to the emergency department for evaluation of right foot and ankle pain after falling at [facility] yesterday.  She is not sure what she slipped on but she did a splits-like movement with her legs.  Her foot and ankle were still hurting this morning.  No additional injuries.  She did not hit her head or lose consciousness.  No numbness, tingling.  Past Medical History:  Diagnosis Date  . Asthma     There are no active problems to display for this patient.   Past Surgical History:  Procedure Laterality Date  . DENTAL SURGERY      Prior to Admission medications   Medication Sig Start Date End Date Taking? Authorizing Provider  acetaminophen-codeine (TYLENOL #3) 300-30 MG tablet Take 1-2 tablets by mouth every 6 (six) hours as needed for moderate pain. 07/20/16   Ivery Quale, PA-C  albuterol (PROVENTIL HFA;VENTOLIN HFA) 108 (90 Base) MCG/ACT inhaler Inhale 2 puffs into the lungs every 4 (four) hours as needed for wheezing or shortness of breath. 04/19/15   Cuthriell, Delorise Royals, PA-C  amoxicillin (AMOXIL) 500 MG capsule Take 500 mg by mouth.    [provider]  brompheniramine-pseudoephedrine-DM 30-2-10 MG/5ML syrup Take 5 mLs by mouth 4 (four) times daily as needed. 12/05/15   Joni Reining, PA-C  cetirizine (ZYRTEC) 10 MG tablet Take 1 tablet (10 mg total) by mouth daily. 09/14/15   Cuthriell, Delorise Royals, PA-C  clindamycin (CLEOCIN) 300 MG capsule Take 1 capsule (300 mg total) by mouth 3 (three) times daily. 07/20/16   Ivery Quale, PA-C  fluticasone (FLONASE) 50 MCG/ACT nasal spray Place 1 spray into both nostrils 2 (two) times daily. 09/14/15   Cuthriell, Delorise Royals,  PA-C  ibuprofen (ADVIL,MOTRIN) 600 MG tablet Take 1 tablet (600 mg total) every 6 (six) hours as needed by mouth. 12/23/16   Enid Derry, PA-C  Norgestim-Eth Estrad Triphasic (ORTHO TRI-CYCLEN LO PO) Take 1 tablet by mouth daily.    [provider]  ondansetron (ZOFRAN) 4 MG tablet Take 1 tablet (4 mg total) by mouth every 8 (eight) hours as needed for nausea or vomiting. 02/26/16   Jeanmarie Plant, MD  oxyCODONE-acetaminophen (ROXICET) 5-325 MG tablet Take 1 tablet by mouth every 4 (four) hours as needed for severe pain. 03/28/16   Irean Hong, MD  penicillin v potassium (VEETID) 500 MG tablet Take 1 tablet (500 mg total) by mouth 4 (four) times daily. 10/25/16   Evon Slack, PA-C  traMADol (ULTRAM) 50 MG tablet Take 1 tablet (50 mg total) by mouth every 6 (six) hours as needed. 10/25/16   Evon Slack, PA-C    Allergies Dye fdc red [red dye] and Shrimp [shellfish allergy]  No family history on file.  Social History Social History   Tobacco Use  . Smoking status: Never Smoker  . Smokeless tobacco: Never Used  Substance Use Topics  . Alcohol use: No  . Drug use: No     Review of Systems  Constitutional: No fever/chills Cardiovascular: No chest pain. Respiratory: No SOB. Gastrointestinal: No abdominal pain.  No nausea, no vomiting.  Musculoskeletal: Positive for foot and ankle pain. Skin: Negative for rash, abrasions, lacerations, ecchymosis. Neurological: Negative for  headaches, numbness or tingling   ____________________________________________   PHYSICAL EXAM:  VITAL SIGNS: ED Triage Vitals [12/23/16 1158]  Enc Vitals Group     BP (!) 149/74     Pulse Rate 80     Resp 18     Temp 97.7 F (36.5 C)     Temp Source Oral     SpO2 100 %     Weight 200 lb (90.7 kg)     Height 5\' 6"  (1.676 m)     Head Circumference      Peak Flow      Pain Score 9     Pain Loc      Pain Edu?      Excl. in GC?      Constitutional: Alert and oriented. Well  appearing and in no acute distress. Eyes: Conjunctivae are normal. PERRL. EOMI. Head: Atraumatic. ENT:      Ears:      Nose: No congestion/rhinnorhea.      Mouth/Throat: Mucous membranes are moist.  Neck: No stridor.   Cardiovascular: Normal rate, regular rhythm.  Good peripheral circulation. Respiratory: Normal respiratory effort without tachypnea or retractions. Lungs CTAB. Good air entry to the bases with no decreased or absent breath sounds. Musculoskeletal: Full range of motion to all extremities. No gross deformities appreciated.  Minimal tenderness to palpation over fifth metatarsal.  No visible swelling or bruising.  Full range of motion of ankle. Neurologic:  Normal speech and language. No gross focal neurologic deficits are appreciated.  Skin:  Skin is warm, dry and intact. No rash noted.    ____________________________________________   LABS (all labs ordered are listed, but only abnormal results are displayed)  Labs Reviewed - No data to display ____________________________________________  EKG   ____________________________________________  RADIOLOGY Lexine BatonI, Iridiana Fonner, personally viewed and evaluated these images (plain radiographs) as part of my medical decision making, as well as reviewing the written report by the radiologist.  Dg Ankle Complete Right  Result Date: 12/23/2016 CLINICAL DATA:  Larey SeatFell yesterday [facility] [employer] twisting RIGHT ankle, RIGHT ankle and foot pain EXAM: RIGHT ANKLE - COMPLETE 3+ VIEW COMPARISON:  None FINDINGS: Scattered soft tissue swelling. Osseous mineralization normal. Joint spaces preserved. Small plantar calcaneal spur. No acute fracture, dislocation, or bone destruction. IMPRESSION: Mild soft tissue swelling without acute bony abnormalities. Electronically Signed   By: Ulyses SouthwardMark  Boles M.D.   On: 12/23/2016 12:32   Dg Foot Complete Right  Result Date: 12/23/2016 CLINICAL DATA:  Status post fall twisting the right ankle yesterday with persistent  ankle and foot pain. The patient is able to bear weight. EXAM: RIGHT FOOT COMPLETE - 3+ VIEW COMPARISON:  Right ankle series of today's date FINDINGS: The bones of the right foot are subjectively adequately mineralized. There is no acute fracture nor dislocation. The joint spaces are reasonably well-maintained. There is a plantar calcaneal spur. The soft tissues are unremarkable. IMPRESSION: There is no acute or significant chronic bony abnormality of the right foot. Electronically Signed   By: David  SwazilandJordan M.D.   On: 12/23/2016 13:31    ____________________________________________    PROCEDURES  Procedure(s) performed:    Procedures    Medications - No data to display   ____________________________________________   INITIAL IMPRESSION / ASSESSMENT AND PLAN / ED COURSE  Pertinent labs & imaging results that were available during my care of the patient were reviewed by me and considered in my medical decision making (see chart for details).  Review of the [address] CSRS  was performed in accordance of the [facility] prior to dispensing any controlled drugs.   Patient presented to the emergency department for evaluation of foot and ankle pain after injury yesterday.  Vital signs and exam are reassuring.  X-ray negative for acute bony abnormalities.  Ankle was ace wrapped and crutches were given.  Education about RICE treatment was provided.  Patient will be discharged home with prescriptions for ibuprofen. Patient is to follow up with PCP as directed. Patient is given ED precautions to return to the ED for any worsening or new symptoms.     ____________________________________________  FINAL CLINICAL IMPRESSION(S) / ED DIAGNOSES  Final diagnoses:  Acute right ankle pain      NEW MEDICATIONS STARTED DURING THIS VISIT:  This SmartLink is deprecated. Use AVSMEDLIST instead to display the medication list for a patient.      This chart was dictated using voice recognition  software/Dragon. Despite best efforts to proofread, errors can occur which can change the meaning. Any change was purely unintentional.    Enid Derry, PA-C 12/23/16 1545    Don Perking, [place], MD 12/23/16 [phone]

## 2017-02-05 ENCOUNTER — Other Ambulatory Visit: Payer: Self-pay

## 2017-02-05 ENCOUNTER — Emergency Department
Admission: EM | Admit: 2017-02-05 | Discharge: 2017-02-05 | Disposition: A | Discharge status: Home / Self Care | Payer: Self-pay | Attending: Emergency Medicine | Admitting: Emergency Medicine

## 2017-02-05 DIAGNOSIS — X58XXXD Exposure to other specified factors, subsequent encounter: Secondary | ICD-10-CM | POA: Insufficient documentation

## 2017-02-05 DIAGNOSIS — S93421D Sprain of deltoid ligament of right ankle, subsequent encounter: Secondary | ICD-10-CM | POA: Insufficient documentation

## 2017-02-05 DIAGNOSIS — J45909 Unspecified asthma, uncomplicated: Secondary | ICD-10-CM | POA: Insufficient documentation

## 2017-02-05 DIAGNOSIS — K047 Periapical abscess without sinus: Secondary | ICD-10-CM | POA: Insufficient documentation

## 2017-02-05 DIAGNOSIS — Z79899 Other long term (current) drug therapy: Secondary | ICD-10-CM | POA: Insufficient documentation

## 2017-02-05 MED ORDER — AMOXICILLIN 500 MG PO CAPS
500.0000 mg | ORAL_CAPSULE | Freq: Once | ORAL | Status: AC
  Administered 2017-02-05: 500 mg via ORAL
  Filled 2017-02-05: qty 1

## 2017-02-05 MED ORDER — AMOXICILLIN 875 MG PO TABS: 875.0000 mg | ORAL_TABLET | Freq: Two times a day (BID) | ORAL | 0 refills | Status: DC

## 2017-02-05 NOTE — ED Notes (Signed)

## 2017-02-05 NOTE — Discharge Instructions (Signed)
Please take antibiotics as prescribed.  Continue with ibuprofen and Tylenol as needed for ankle pain.  I recommend you purchase an ankle stabilizing brace or lace up ankle brace to help with ankle pain.  Please wear brace at all times except for showering and sleeping.  Follow-up with orthopedics in dental clinic if no improvement with ankle or dental pain in 1 week.

## 2017-02-05 NOTE — ED Triage Notes (Signed)
Pt in with co right ankle pain since she fell 4 weeks ago, was seen here for the same and had neg xray. Pt also co toothache states has been getting infection to gum.

## 2017-02-05 NOTE — ED Provider Notes (Signed)
[facility] EMERGENCY DEPARTMENT Provider Note   CSN: [id number] Arrival date & time: 02/05/17  2131     History   Chief Complaint Chief Complaint  Patient presents with  . Ankle Pain    HPI Debbie Leon is a 56 y.o. female presents to the emergency department for evaluation of left ankle pain and right sided dental pain.  Patient states she injured her right ankle back on 12/23/2016, was seen in the emergency department where x-rays showed no evidence of acute bony abnormality.  Her pain is along the medial malleolus and deltoid ligament.  She has a sharp pain with ambulation.  She continues to ambulate with no assistive devices.  Occasionally she will take ibuprofen for pain.  Pain is moderate.  She denies any numbness or tingling.  No knee pain.  Patient also complaining of right sided dental pain, has had intermittent episodes of right-sided dental pain for a couple of years.  She has had a couple teeth removed and states that there is a piece of bone that has not been removed that is giving her intermittent infections.  She responds well to amoxicillin.  She has not had any antibiotics in 3 months.  She is scheduled to see the dentist.  She denies any fevers, difficulty swallowing.  She describes mild right-sided facial swelling with intermittent drainage that she describes as pus.  HPI  Past Medical History:  Diagnosis Date  . Asthma     There are no active problems to display for this patient.   Past Surgical History:  Procedure Laterality Date  . DENTAL SURGERY      OB History    No data available       Home Medications    Prior to Admission medications   Medication Sig Start Date End Date Taking? Authorizing Provider  acetaminophen-codeine (TYLENOL #3) 300-30 MG tablet Take 1-2 tablets by mouth every 6 (six) hours as needed for moderate pain. 07/20/16   Ivery Quale, PA-C  albuterol (PROVENTIL HFA;VENTOLIN HFA) 108 (90 Base) MCG/ACT  inhaler Inhale 2 puffs into the lungs every 4 (four) hours as needed for wheezing or shortness of breath. 04/19/15   Cuthriell, Delorise Royals, PA-C  amoxicillin (AMOXIL) 875 MG tablet Take 1 tablet (875 mg total) by mouth 2 (two) times daily. X 10 days 02/05/17   Evon Slack, PA-C  brompheniramine-pseudoephedrine-DM 30-2-10 MG/5ML syrup Take 5 mLs by mouth 4 (four) times daily as needed. 12/05/15   Joni Reining, PA-C  cetirizine (ZYRTEC) 10 MG tablet Take 1 tablet (10 mg total) by mouth daily. 09/14/15   Cuthriell, Delorise Royals, PA-C  clindamycin (CLEOCIN) 300 MG capsule Take 1 capsule (300 mg total) by mouth 3 (three) times daily. 07/20/16   Ivery Quale, PA-C  fluticasone (FLONASE) 50 MCG/ACT nasal spray Place 1 spray into both nostrils 2 (two) times daily. 09/14/15   Cuthriell, Delorise Royals, PA-C  ibuprofen (ADVIL,MOTRIN) 600 MG tablet Take 1 tablet (600 mg total) every 6 (six) hours as needed by mouth. 12/23/16   Enid Derry, PA-C  Norgestim-Eth Estrad Triphasic (ORTHO TRI-CYCLEN LO PO) Take 1 tablet by mouth daily.    [provider]  ondansetron (ZOFRAN) 4 MG tablet Take 1 tablet (4 mg total) by mouth every 8 (eight) hours as needed for nausea or vomiting. 02/26/16   Jeanmarie Plant, MD  oxyCODONE-acetaminophen (ROXICET) 5-325 MG tablet Take 1 tablet by mouth every 4 (four) hours as needed for severe pain. 03/28/16  Irean HongSung, Jade J, MD  penicillin v potassium (VEETID) 500 MG tablet Take 1 tablet (500 mg total) by mouth 4 (four) times daily. 10/25/16   Evon SlackGaines, Clarity Ciszek C, PA-C  traMADol (ULTRAM) 50 MG tablet Take 1 tablet (50 mg total) by mouth every 6 (six) hours as needed. 10/25/16   Evon SlackGaines, Lijah Bourque C, PA-C    Family History No family history on file.  Social History Social History   Tobacco Use  . Smoking status: Never Smoker  . Smokeless tobacco: Never Used  Substance Use Topics  . Alcohol use: No  . Drug use: No     Allergies   Dye fdc red [red dye] and Shrimp [shellfish  allergy]   Review of Systems Review of Systems  Constitutional: Negative.  Negative for chills and fever.  HENT: Positive for dental problem and facial swelling. Negative for drooling, mouth sores, trouble swallowing and voice change.   Respiratory: Negative for shortness of breath.   Cardiovascular: Negative for chest pain.  Gastrointestinal: Negative for nausea and vomiting.  Musculoskeletal: Positive for arthralgias. Negative for neck pain and neck stiffness.  Skin: Negative.   Psychiatric/Behavioral: Negative for confusion.  All other systems reviewed and are negative.    Physical Exam Updated Vital Signs BP (!) 153/100 (BP Location: Left Arm)   Pulse 80   Temp 98 F (36.7 C) (Oral)   Resp 20   Ht 5\' 6"  (1.676 m)   Wt 82.6 kg (182 lb)   SpO2 100%   BMI 29.38 kg/m   Physical Exam  Constitutional: She is oriented to person, place, and time. She appears well-developed and well-nourished. No distress.  HENT:  Head: Normocephalic and atraumatic.  Right Ear: External ear normal.  Left Ear: External ear normal.  Nose: Nose normal.  Mouth/Throat: Oropharynx is clear and moist. No oropharyngeal exudate.  Mild right-sided facial swelling along the right lower jaw.  No palpable fluctuance or abscess.  Mild dental caries but no gross dental infection visualized.  No visible drainage noted.  Eyes: Conjunctivae are normal. Right eye exhibits no discharge. Left eye exhibits no discharge.  Neck: Normal range of motion.  Cardiovascular: Normal rate.  Pulmonary/Chest: No respiratory distress.  Musculoskeletal: Normal range of motion. She exhibits no deformity.  Examination of the right ankle shows patient is tender along the medial malleolus along the deltoid ligament.  Ankle stable to stress testing.  She is able to ankle plantarflex and dorsiflex.  She is nontender throughout the lower extremity up to the knee.  She is nontender throughout the foot.  No swelling warmth or erythema.   Calf is nontender to palpation.  Neurological: She is alert and oriented to person, place, and time. She has normal reflexes.  Skin: Skin is warm and dry.  Psychiatric: She has a normal mood and affect. Her behavior is normal. Thought content normal.     ED Treatments / Results  Labs (all labs ordered are listed, but only abnormal results are displayed) Labs Reviewed - No data to display  EKG  EKG Interpretation None       Radiology No results found.  Procedures Procedures (including critical care time)  Medications Ordered in ED Medications  amoxicillin (AMOXIL) capsule 500 mg (500 mg Oral Given 02/05/17 2227)     Initial Impression / Assessment and Plan / ED Course  I have reviewed the triage vital signs and the nursing notes.  Pertinent labs & imaging results that were available during my care of the patient were  reviewed by me and considered in my medical decision making (see chart for details).     56 year old female with persistent right ankle pain, x-rays from 12/23/2016 showed no evidence of acute bony abnormality.  These were reviewed by me today.  She is encouraged to purchase over-the-counter ankle stirrup lace up brace and wear.  She will continue with Tylenol and ibuprofen.  She will follow with orthopedics if no improvement in 1 week.  In regards to dental pain and right-sided facial swelling.  Patient is placed on antibiotic.  She is encouraged to follow-up with dentist.  She is educated on signs and symptoms return to clinic for.  Final Clinical Impressions(s) / ED Diagnoses   Final diagnoses:  Sprain of deltoid ligament of right ankle, subsequent encounter  Dental abscess    ED Discharge Orders        Ordered    amoxicillin (AMOXIL) 875 MG tablet  2 times daily     02/05/17 2225       Ronnette JuniperGaines, Coury Grieger C, PA-C 02/05/17 2308    Loleta RoseForbach, Cory, MD 02/05/17 2310

## 2017-05-07 ENCOUNTER — Emergency Department
Admission: EM | Admit: 2017-05-07 | Discharge: 2017-05-07 | Disposition: A | Discharge status: Home / Self Care | Payer: Medicaid Other | Attending: Emergency Medicine | Admitting: Emergency Medicine

## 2017-05-07 ENCOUNTER — Encounter: Payer: Self-pay | Admitting: Emergency Medicine

## 2017-05-07 ENCOUNTER — Other Ambulatory Visit: Payer: Self-pay

## 2017-05-07 DIAGNOSIS — J45909 Unspecified asthma, uncomplicated: Secondary | ICD-10-CM | POA: Insufficient documentation

## 2017-05-07 DIAGNOSIS — Z79899 Other long term (current) drug therapy: Secondary | ICD-10-CM | POA: Insufficient documentation

## 2017-05-07 DIAGNOSIS — J0100 Acute maxillary sinusitis, unspecified: Secondary | ICD-10-CM | POA: Insufficient documentation

## 2017-05-07 MED ORDER — ALBUTEROL SULFATE HFA 108 (90 BASE) MCG/ACT IN AERS: 2.0000 | INHALATION_SPRAY | Freq: Four times a day (QID) | RESPIRATORY_TRACT | 2 refills | Status: AC | PRN

## 2017-05-07 MED ORDER — AMOXICILLIN-POT CLAVULANATE 875-125 MG PO TABS
1.0000 | ORAL_TABLET | Freq: Once | ORAL | Status: AC
  Administered 2017-05-07: 1 via ORAL
  Filled 2017-05-07: qty 1

## 2017-05-07 MED ORDER — AMOXICILLIN-POT CLAVULANATE 875-125 MG PO TABS: 1.0000 | ORAL_TABLET | Freq: Two times a day (BID) | ORAL | 0 refills | Status: AC

## 2017-05-07 NOTE — ED Provider Notes (Signed)
[facility] Emergency Department Provider Note  ____________________________________________  Time seen: Approximately 11:25 PM  I have reviewed the triage vital signs and the nursing notes.   HISTORY  Chief Complaint Dental Pain    HPI Vicente SereneSharon W Cuoco is a 57 y.o. female presents to the emergency department with malaise, purulent rhinorrhea, maxillary sinus tenderness and dental pain for 1 week.  Patient denies fever, chills, nausea and vomiting.  No alleviating measures have been attempted.   Past Medical History:  Diagnosis Date  . Asthma     There are no active problems to display for this patient.   Past Surgical History:  Procedure Laterality Date  . DENTAL SURGERY      Prior to Admission medications   Medication Sig Start Date End Date Taking? Authorizing Provider  acetaminophen-codeine (TYLENOL #3) 300-30 MG tablet Take 1-2 tablets by mouth every 6 (six) hours as needed for moderate pain. 07/20/16   Ivery QualeBryant, Hobson, PA-C  albuterol (PROVENTIL HFA;VENTOLIN HFA) 108 (90 Base) MCG/ACT inhaler Inhale 2 puffs into the lungs every 6 (six) hours as needed for wheezing or shortness of breath. 05/07/17   Orvil FeilWoods, Brookelynn Hamor M, PA-C  amoxicillin (AMOXIL) 875 MG tablet Take 1 tablet (875 mg total) by mouth 2 (two) times daily. X 10 days 02/05/17   Evon SlackGaines, Thomas C, PA-C  amoxicillin-clavulanate (AUGMENTIN) 875-125 MG tablet Take 1 tablet by mouth 2 (two) times daily for 10 days. 05/07/17 05/17/17  Orvil FeilWoods, Avah Bashor M, PA-C  brompheniramine-pseudoephedrine-DM 30-2-10 MG/5ML syrup Take 5 mLs by mouth 4 (four) times daily as needed. 12/05/15   Joni ReiningSmith, Ronald K, PA-C  cetirizine (ZYRTEC) 10 MG tablet Take 1 tablet (10 mg total) by mouth daily. 09/14/15   Cuthriell, Delorise RoyalsJonathan D, PA-C  clindamycin (CLEOCIN) 300 MG capsule Take 1 capsule (300 mg total) by mouth 3 (three) times daily. 07/20/16   Ivery QualeBryant, Hobson, PA-C  fluticasone (FLONASE) 50 MCG/ACT nasal spray Place 1 spray into both  nostrils 2 (two) times daily. 09/14/15   Cuthriell, Delorise RoyalsJonathan D, PA-C  ibuprofen (ADVIL,MOTRIN) 600 MG tablet Take 1 tablet (600 mg total) every 6 (six) hours as needed by mouth. 12/23/16   Enid DerryWagner, Ashley, PA-C  Norgestim-Eth Estrad Triphasic (ORTHO TRI-CYCLEN LO PO) Take 1 tablet by mouth daily.    [provider]  ondansetron (ZOFRAN) 4 MG tablet Take 1 tablet (4 mg total) by mouth every 8 (eight) hours as needed for nausea or vomiting. 02/26/16   Jeanmarie PlantMcShane, James A, MD  oxyCODONE-acetaminophen (ROXICET) 5-325 MG tablet Take 1 tablet by mouth every 4 (four) hours as needed for severe pain. 03/28/16   Irean HongSung, Jade J, MD  penicillin v potassium (VEETID) 500 MG tablet Take 1 tablet (500 mg total) by mouth 4 (four) times daily. 10/25/16   Evon SlackGaines, Thomas C, PA-C  traMADol (ULTRAM) 50 MG tablet Take 1 tablet (50 mg total) by mouth every 6 (six) hours as needed. 10/25/16   Evon SlackGaines, Thomas C, PA-C    Allergies Dye fdc red [red dye] and Shrimp [shellfish allergy]  No family history on file.  Social History Social History   Tobacco Use  . Smoking status: Never Smoker  . Smokeless tobacco: Never Used  Substance Use Topics  . Alcohol use: No  . Drug use: No     Review of Systems  Constitutional: No fever/chills Eyes: No visual changes. No discharge ENT: Patient has purulent rhinorrhea and maxillary sinus tenderness. Cardiovascular: no chest pain. Respiratory: no cough. No SOB. Gastrointestinal: No abdominal pain.  No nausea, no vomiting.  No diarrhea.  No constipation. Musculoskeletal: Negative for musculoskeletal pain. Skin: Negative for rash, abrasions, lacerations, ecchymosis. Neurological: Negative for headaches, focal weakness or numbness.   ____________________________________________   PHYSICAL EXAM:  VITAL SIGNS: ED Triage Vitals  Enc Vitals Group     BP 05/07/17 2116 (!) 147/72     Pulse Rate 05/07/17 2116 73     Resp 05/07/17 2116 18     Temp 05/07/17 2116 (!) 97.5 F (36.4  C)     Temp Source 05/07/17 2116 Oral     SpO2 05/07/17 2116 100 %     Weight 05/07/17 2116 200 lb (90.7 kg)     Height 05/07/17 2116 5\' 6"  (1.676 m)     Head Circumference --      Peak Flow --      Pain Score 05/07/17 2115 10     Pain Loc --      Pain Edu? --      Excl. in GC? --      Constitutional: Alert and oriented. Well appearing and in no acute distress. Eyes: Conjunctivae are normal. PERRL. EOMI. Head: Atraumatic.  Patient has maxillary sinus tenderness ENT:      Ears: TMs are pearly.      Nose: No congestion/rhinnorhea.      Mouth/Throat: Mucous membranes are moist.  Neck: No stridor.  No cervical spine tenderness to palpation..  She is patient has maxillary sinus tenderness. Cardiovascular: Normal rate, regular rhythm. Normal S1 and S2.  Good peripheral circulation. Respiratory: Normal respiratory effort without tachypnea or retractions. Lungs CTAB. Good air entry to the bases with no decreased or absent breath sounds. Gastrointestinal: Bowel sounds 4 quadrants. Soft and nontender to palpation. No guarding or rigidity. No palpable masses. No distention. No CVA tenderness. Musculoskeletal: Full range of motion to all extremities. No gross deformities appreciated. Neurologic:  Normal speech and language. No gross focal neurologic deficits are appreciated.  Skin:  Skin is warm, dry and intact. No rash noted.  ____________________________________________   LABS (all labs ordered are listed, but only abnormal results are displayed)  Labs Reviewed - No data to display ____________________________________________  EKG   ____________________________________________  RADIOLOGY  No results found.  ____________________________________________    PROCEDURES  Procedure(s) performed:    Procedures    Medications  amoxicillin-clavulanate (AUGMENTIN) 875-125 MG per tablet 1 tablet (1 tablet Oral Given 05/07/17 2212)      ____________________________________________   INITIAL IMPRESSION / ASSESSMENT AND PLAN / ED COURSE  Pertinent labs & imaging results that were available during my care of the patient were reviewed by me and considered in my medical decision making (see chart for details).  Review of the [place] CSRS was performed in accordance of the [facility] prior to dispensing any controlled drugs.     Assessment and plan Sinusitis Patient presents to the emergency department with malaise, purulent rhinorrhea and maxillary sinus tenderness.  Patient was treated empirically for sinusitis with Augmentin.  She was advised to follow-up with primary care as needed.  All patient questions were answered.   ____________________________________________  FINAL CLINICAL IMPRESSION(S) / ED DIAGNOSES  Final diagnoses:  Acute non-recurrent maxillary sinusitis      NEW MEDICATIONS STARTED DURING THIS VISIT:  ED Discharge Orders        Ordered    amoxicillin-clavulanate (AUGMENTIN) 875-125 MG tablet  2 times daily     05/07/17 2210    albuterol (PROVENTIL HFA;VENTOLIN HFA) 108 (90 Base) MCG/ACT inhaler  Every 6 hours PRN     05/07/17 2221          This chart was dictated using voice recognition software/Dragon. Despite best efforts to proofread, errors can occur which can change the meaning. Any change was purely unintentional.    Orvil Feil, PA-C 05/07/17 2327    Rockne Menghini, MD 05/07/17 [phone]

## 2017-05-07 NOTE — ED Notes (Signed)
Pt c/o right sided dental pain x1week. Pt reports pain throughout the right side of her face.

## 2017-05-07 NOTE — ED Triage Notes (Signed)
Patient ambulatory to triage with steady gait, without difficulty or distress noted; pt reports right sided dental & sinus pain since last week

## 2017-10-28 ENCOUNTER — Emergency Department
Admission: EM | Admit: 2017-10-28 | Discharge: 2017-10-28 | Disposition: A | Discharge status: Home / Self Care | Payer: Medicaid Other | Attending: Emergency Medicine | Admitting: Emergency Medicine

## 2017-10-28 ENCOUNTER — Encounter: Payer: Self-pay | Admitting: Emergency Medicine

## 2017-10-28 DIAGNOSIS — Z79899 Other long term (current) drug therapy: Secondary | ICD-10-CM | POA: Insufficient documentation

## 2017-10-28 DIAGNOSIS — J45909 Unspecified asthma, uncomplicated: Secondary | ICD-10-CM | POA: Insufficient documentation

## 2017-10-28 DIAGNOSIS — K0889 Other specified disorders of teeth and supporting structures: Secondary | ICD-10-CM | POA: Insufficient documentation

## 2017-10-28 MED ORDER — AMOXICILLIN 500 MG PO CAPS
500.0000 mg | ORAL_CAPSULE | Freq: Once | ORAL | Status: AC
  Administered 2017-10-28: 500 mg via ORAL
  Filled 2017-10-28: qty 1

## 2017-10-28 MED ORDER — AMOXICILLIN 500 MG PO TABS: 500.0000 mg | ORAL_TABLET | Freq: Two times a day (BID) | ORAL | 0 refills | Status: AC

## 2017-10-28 NOTE — Discharge Instructions (Signed)
OPTIONS FOR DENTAL FOLLOW UP CARE ° °[place] Department of Health and Human Services - Local Safety Net Dental Clinics °http://www.ncdhhs.gov/dph/oralhealth/services/safetynetclinics.htm °  °[facility] (336-562-3123) ° °[facility] [place] ([phone]) ° °[facility] ([phone] ext [phone]) ° °[facility] ([phone]) ° °[facility] ([phone]) °This clinic caters to the indigent population and is on a lottery system. °Location: °[facility], [address], [place] °Clinic Hours: °Wednesdays from 6pm - 9pm, patients seen by a lottery system. °For dates, call or go to [email or web address].edu/[facility]/patients/Dental-[facility] °Services: °Cleanings, fillings and simple extractions. °Payment Options: °DENTAL WORK IS FREE OF CHARGE. Bring proof of income or support. °Best way to get seen: °Arrive at 5:15 pm - this is a lottery, NOT first come/first serve, so arriving earlier will not increase your chances of being seen. °  °  °[facility] °[phone] °Select option 1 for emergencies °  °Location: °[facility], [address], [place] °Clinic Hours: °No walk-ins accepted - call the day before to schedule an appointment. °Check in times are 9:30 am and 1:30 pm. °Services: °Simple extractions, temporary fillings, pulpectomy/pulp debridement, uncomplicated abscess drainage. °Payment Options: °PAYMENT IS DUE AT THE TIME OF SERVICE.  Fee is usually $100-200, additional surgical procedures (e.g. abscess drainage) may be extra. °Cash, checks, Visa/MasterCard accepted.  Can file Medicaid if patient is covered for dental - patient should call case worker to check. °No discount for [facility] patients. °Best way to get seen: °MUST call the day before and get onto the schedule. Can usually be seen the next 1-2 days. No walk-ins accepted. °  °  °[facility] °[phone] °   °Location: °[facility], [address], [place] °Clinic Hours: °M, W, Th, F 8am or 1:30pm, Tues 9a or 1:30 - first come/first served. °Services: °Simple extractions, temporary fillings, uncomplicated abscess drainage.  You do not need to be an [place] resident. °Payment Options: °PAYMENT IS DUE AT THE TIME OF SERVICE. °Dental insurance, otherwise sliding scale - bring proof of income or support. °Depending on income and treatment needed, cost is usually $50-200. °Best way to get seen: °Arrive early as it is first come/first served. °  °  °[facility] °[phone] °  °Location: °[address] °Clinic Hours: °Mon-Thu 8a-5p °Services: °Most basic dental services including extractions and fillings. °Payment Options: °PAYMENT IS DUE AT THE TIME OF SERVICE. °Sliding scale, up to 50% off - bring proof if income or support. °Medicaid with dental option accepted. °Best way to get seen: °Call to schedule an appointment, can usually be seen within 2 weeks OR they will try to see walk-ins - show up at 8a or 2p (you may have to wait). °  °  °[facility] °[phone] °[place] RESIDENTS ONLY °  °Location: °[facility], [address], [place], [place] [address] °Clinic Hours: By appointment only. °Monday - Thursday 8am-5pm, Friday 8am-12pm °Services: Cleanings, fillings, extractions. °Payment Options: °PAYMENT IS DUE AT THE TIME OF SERVICE. °Cash, Visa or MasterCard. Sliding scale - $30 minimum per service. °Best way to get seen: °Come in to office, complete packet and make an appointment - need proof of income °or support monies for each household member and proof of [place] residence. °Usually takes about a month to get in. °  °  °[facility] °[phone] °  °Location: °[address]   [place] °Clinic Hours: Walk-in Urgent Care Dental Services are offered Monday-Friday  mornings only. °The numbers of emergencies accepted daily is limited to the number of °providers available. °Maximum 15 - Mondays, Wednesdays & Thursdays °Maximum 10 - Tuesdays & Fridays °Services: °You do not need to be a  [place] resident to be seen for a dental emergency. °Emergencies are defined as pain, swelling, abnormal bleeding, or dental trauma. Walkins will receive x-rays if needed. °NOTE: Dental cleaning is not an emergency. °Payment Options: °PAYMENT IS DUE AT THE TIME OF SERVICE. °Minimum co-pay is $40.00 for uninsured patients. °Minimum co-pay is $3.00 for Medicaid with dental coverage. °Dental Insurance is accepted and must be presented at time of visit. °Medicare does not cover dental. °Forms of payment: Cash, credit card, checks. °Best way to get seen: °If not previously registered with the clinic, walk-in dental registration begins at 7:15 am and is on a first come/first serve basis. °If previously registered with the clinic, call to make an appointment. °  °  °[facility] °[phone] °[place] RESIDENTS ONLY °  °Location: °[address], [place], [place] °Clinic Hours: °Mon-Thu 10a-2p °Services: Extractions only! °Payment Options: °FREE (donations accepted) - bring proof of income or support °Best way to get seen: °Call and schedule an appointment OR come at 8am on the 1st Monday of every month (except for holidays) when it is first come/first served. °  °  °[facility] °[phone] °  °Location: °[address], [place] °Clinic Hours: °Friday mornings °Services, Payment Options, Best way to get seen: °Call for info °

## 2017-10-28 NOTE — ED Triage Notes (Signed)
Pt reports she has had chronic gum pain since she had tooth removed 3 years ago. Pt sts, "It keeps making noises. I can hear the infection in the black hole." On assessment gum is not discolored, no "hole" seen and no drainage noted. Pt has several black markings on back teeth, bilaterally.

## 2017-10-28 NOTE — ED Notes (Signed)
E-signature pad not working in the room.

## 2017-10-28 NOTE — ED Provider Notes (Signed)
[facility] Emergency Department Provider Note  ____________________________________________  Time seen: Approximately 10:59 PM  I have reviewed the triage vital signs and the nursing notes.   HISTORY  Chief Complaint Dental Pain    HPI Debbie Leon is a 57 y.o. 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  Patient denies fever and chills but conveys that her "right side of face is swollen".  She is having no difficulty eating on the affected side.  Patient reports that she has followed up with her oral surgeon and he has no advice for her.  Patient has been taking ibuprofen.  Past Medical History:  Diagnosis Date  . Asthma     There are no active problems to display for this patient.   Past Surgical History:  Procedure Laterality Date  . DENTAL SURGERY      Prior to Admission medications   Medication Sig Start Date End Date Taking? Authorizing Provider  acetaminophen-codeine (TYLENOL #3) 300-30 MG tablet Take 1-2 tablets by mouth every 6 (six) hours as needed for moderate pain. 07/20/16   Ivery Quale, PA-C  albuterol (PROVENTIL HFA;VENTOLIN HFA) 108 (90 Base) MCG/ACT inhaler Inhale 2 puffs into the lungs every 6 (six) hours as needed for wheezing or shortness of breath. 05/07/17   Orvil Feil, PA-C  amoxicillin (AMOXIL) 500 MG tablet Take 1 tablet (500 mg total) by mouth 2 (two) times daily for 10 days. 10/28/17 11/07/17  Orvil Feil, PA-C  brompheniramine-pseudoephedrine-DM 30-2-10 MG/5ML syrup Take 5 mLs by mouth 4 (four) times daily as needed. 12/05/15   Joni Reining, PA-C  cetirizine (ZYRTEC) 10 MG tablet Take 1 tablet (10 mg total) by mouth daily. 09/14/15   Cuthriell, Delorise Royals, PA-C  clindamycin (CLEOCIN) 300 MG capsule Take 1 capsule (300 mg total) by mouth 3 (three) times daily. 07/20/16   Ivery Quale, PA-C   fluticasone (FLONASE) 50 MCG/ACT nasal spray Place 1 spray into both nostrils 2 (two) times daily. 09/14/15   Cuthriell, Delorise Royals, PA-C  ibuprofen (ADVIL,MOTRIN) 600 MG tablet Take 1 tablet (600 mg total) every 6 (six) hours as needed by mouth. 12/23/16   Enid Derry, PA-C  Norgestim-Eth Estrad Triphasic (ORTHO TRI-CYCLEN LO PO) Take 1 tablet by mouth daily.    [provider]  ondansetron (ZOFRAN) 4 MG tablet Take 1 tablet (4 mg total) by mouth every 8 (eight) hours as needed for nausea or vomiting. 02/26/16   Jeanmarie Plant, MD  oxyCODONE-acetaminophen (ROXICET) 5-325 MG tablet Take 1 tablet by mouth every 4 (four) hours as needed for severe pain. 03/28/16   Irean Hong, MD  penicillin v potassium (VEETID) 500 MG tablet Take 1 tablet (500 mg total) by mouth 4 (four) times daily. 10/25/16   Evon Slack, PA-C  traMADol (ULTRAM) 50 MG tablet Take 1 tablet (50 mg total) by mouth every 6 (six) hours as needed. 10/25/16   Evon Slack, PA-C    Allergies Dye fdc red [red dye] and Shrimp [shellfish allergy]  History reviewed. No pertinent family history.  Social History Social History   Tobacco Use  . Smoking status: Never Smoker  . Smokeless tobacco: Never Used  Substance Use Topics  . Alcohol use: No  . Drug use: No     Review of Systems  Constitutional: No fever/chills Eyes: No visual changes. No discharge ENT:  Patient has chronic dental abscess.  Cardiovascular: no chest pain. Respiratory: no cough. No SOB. Gastrointestinal: No abdominal pain.  No nausea, no vomiting.  No diarrhea.  No constipation. Musculoskeletal: Negative for musculoskeletal pain. Skin: Negative for rash, abrasions, lacerations, ecchymosis. Neurological: Negative for headaches, focal weakness or numbness.   ____________________________________________   PHYSICAL EXAM:  VITAL SIGNS: ED Triage Vitals  Enc Vitals Group     BP 10/28/17 2110 (!) 143/80     Pulse Rate 10/28/17 2110 80      Resp 10/28/17 2110 17     Temp 10/28/17 2110 98.4 F (36.9 C)     Temp Source 10/28/17 2110 Oral     SpO2 10/28/17 2110 98 %     Weight --      Height --      Head Circumference --      Peak Flow --      Pain Score 10/28/17 2238 10     Pain Loc --      Pain Edu? --      Excl. in GC? --      Constitutional: Alert and oriented. Well appearing and in no acute distress. Eyes: Conjunctivae are normal. PERRL. EOMI. Head: Atraumatic. ENT:      Ears: TMs are pearly.      Nose: No congestion/rhinnorhea.      Mouth/Throat: Mucous membranes are moist.  Patient has mild edema of the right lower jaw.  No palpable fluctuance or induration at extraction site for inferior 28.  No pain with palpation of the tongue. Hematological/Lymphatic/Immunilogical: No cervical lymphadenopathy.  Cardiovascular: Normal rate, regular rhythm. Normal S1 and S2.  Good peripheral circulation. Respiratory: Normal respiratory effort without tachypnea or retractions. Lungs CTAB. Good air entry to the bases with no decreased or absent breath sounds. Skin:  Skin is warm, dry and intact. No rash noted.  ____________________________________________   LABS (all labs ordered are listed, but only abnormal results are displayed)  Labs Reviewed - No data to display ____________________________________________  EKG   ____________________________________________  RADIOLOGY   No results found.  ____________________________________________    PROCEDURES  Procedure(s) performed:    Procedures    Medications  amoxicillin (AMOXIL) capsule 500 mg (has no administration in time range)     ____________________________________________   INITIAL IMPRESSION / ASSESSMENT AND PLAN / ED COURSE  Pertinent labs & imaging results that were available during my care of the patient were reviewed by me and considered in my medical decision making (see chart for details).  Review of the [place] CSRS was performed in  accordance of the [facility] prior to dispensing any controlled drugs.      Assessment and plan Dental pain Patient presents to the emergency department with concern for dental abscess.  Patient has mild edema of the right lower jaw.  There was no induration or palpable fluctuance at extraction site for inferior 28.  Patient reports that this is a chronic issue and that she often has to be treated with amoxicillin for chronic abscess.  Patient was started empirically on amoxicillin.  She was advised to follow-up with local dentist soon as possible.  All patient questions were answered.     ____________________________________________  FINAL CLINICAL IMPRESSION(S) / ED DIAGNOSES  Final diagnoses:  Pain, dental      NEW MEDICATIONS STARTED DURING THIS VISIT:  ED Discharge Orders         Ordered    amoxicillin (AMOXIL) 500 MG tablet  2 times daily  10/28/17 2256              This chart was dictated using voice recognition software/Dragon. Despite best efforts to proofread, errors can occur which can change the meaning. Any change was purely unintentional.    Orvil Feil, PA-C 10/28/17 2305    Nita Sickle, MD 10/28/17 [phone]

## 2018-06-24 IMAGING — DX DG ANKLE COMPLETE 3+V*R*
3 series · 3 of 3 positions shown · non-contrast
Comparison: None

CLINICAL DATA: Fell yesterday at [place] twisting RIGHT ankle,
RIGHT ankle and foot pain

EXAM:
RIGHT ANKLE - COMPLETE 3+ VIEW

[ankle ap]
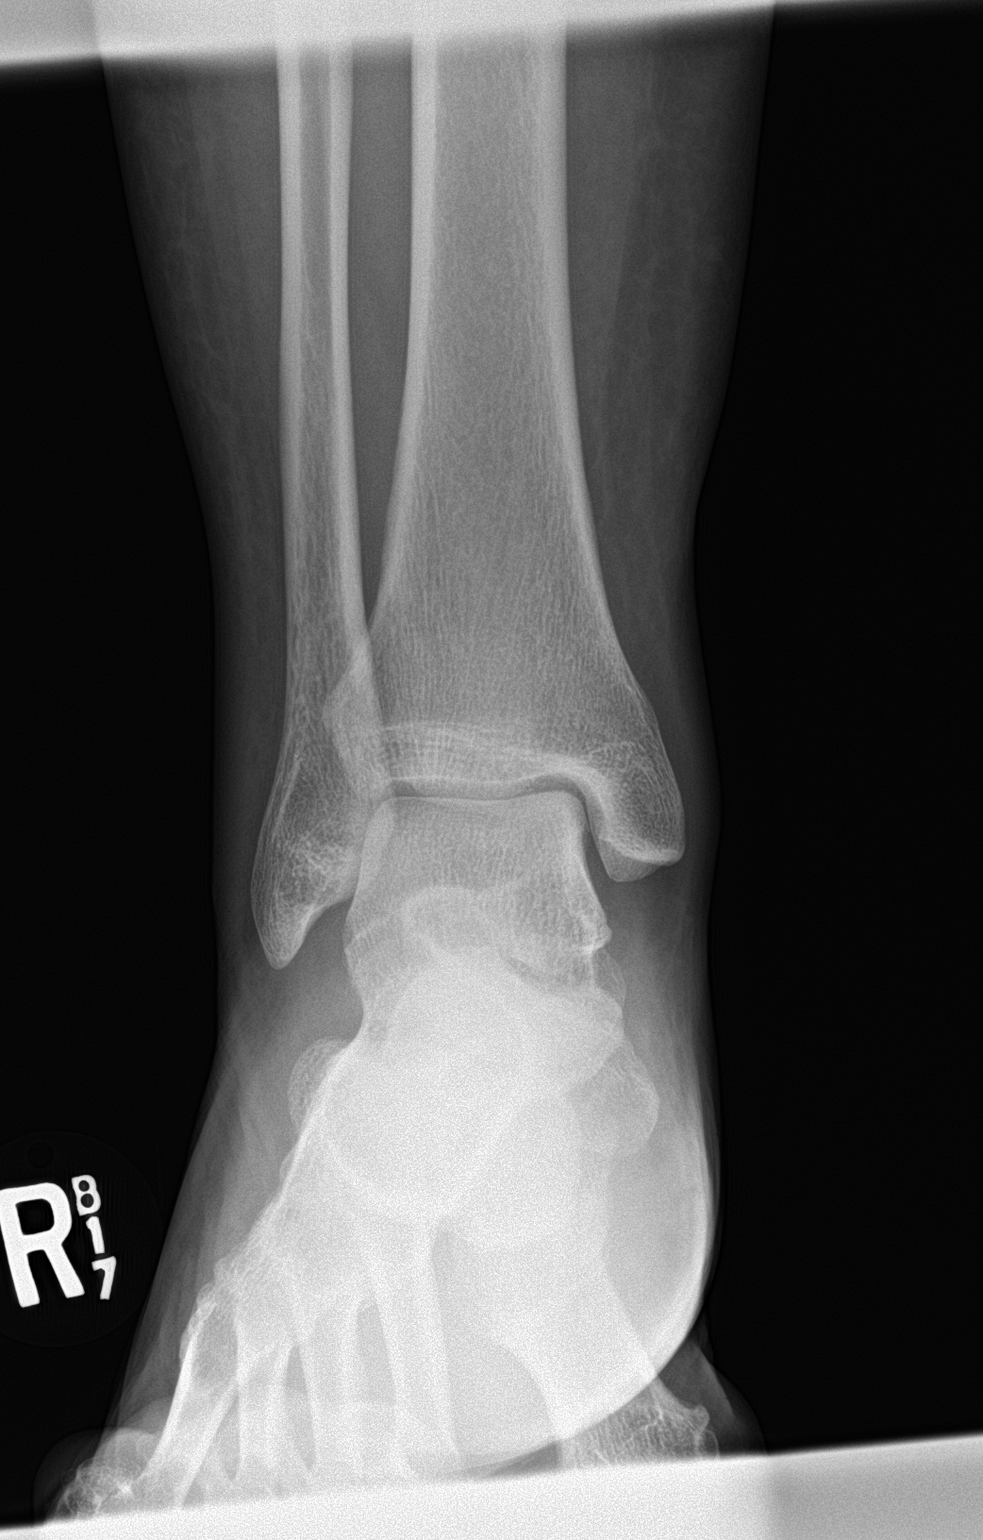

[ankle obl]
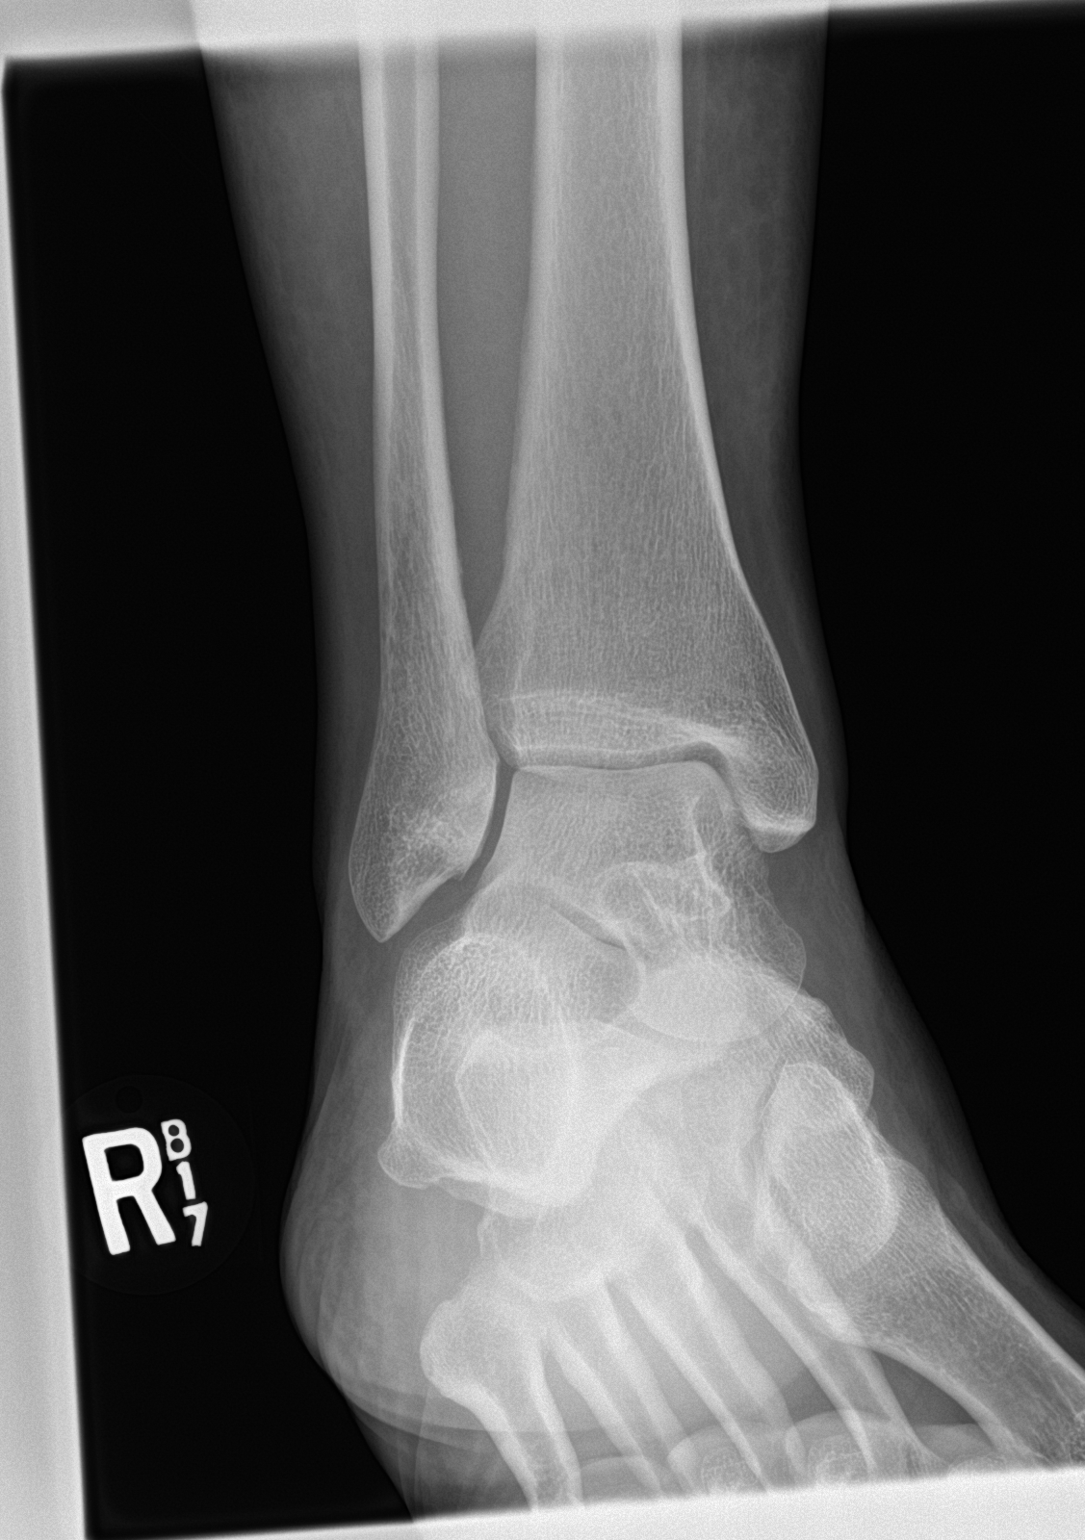

[ankle lat]
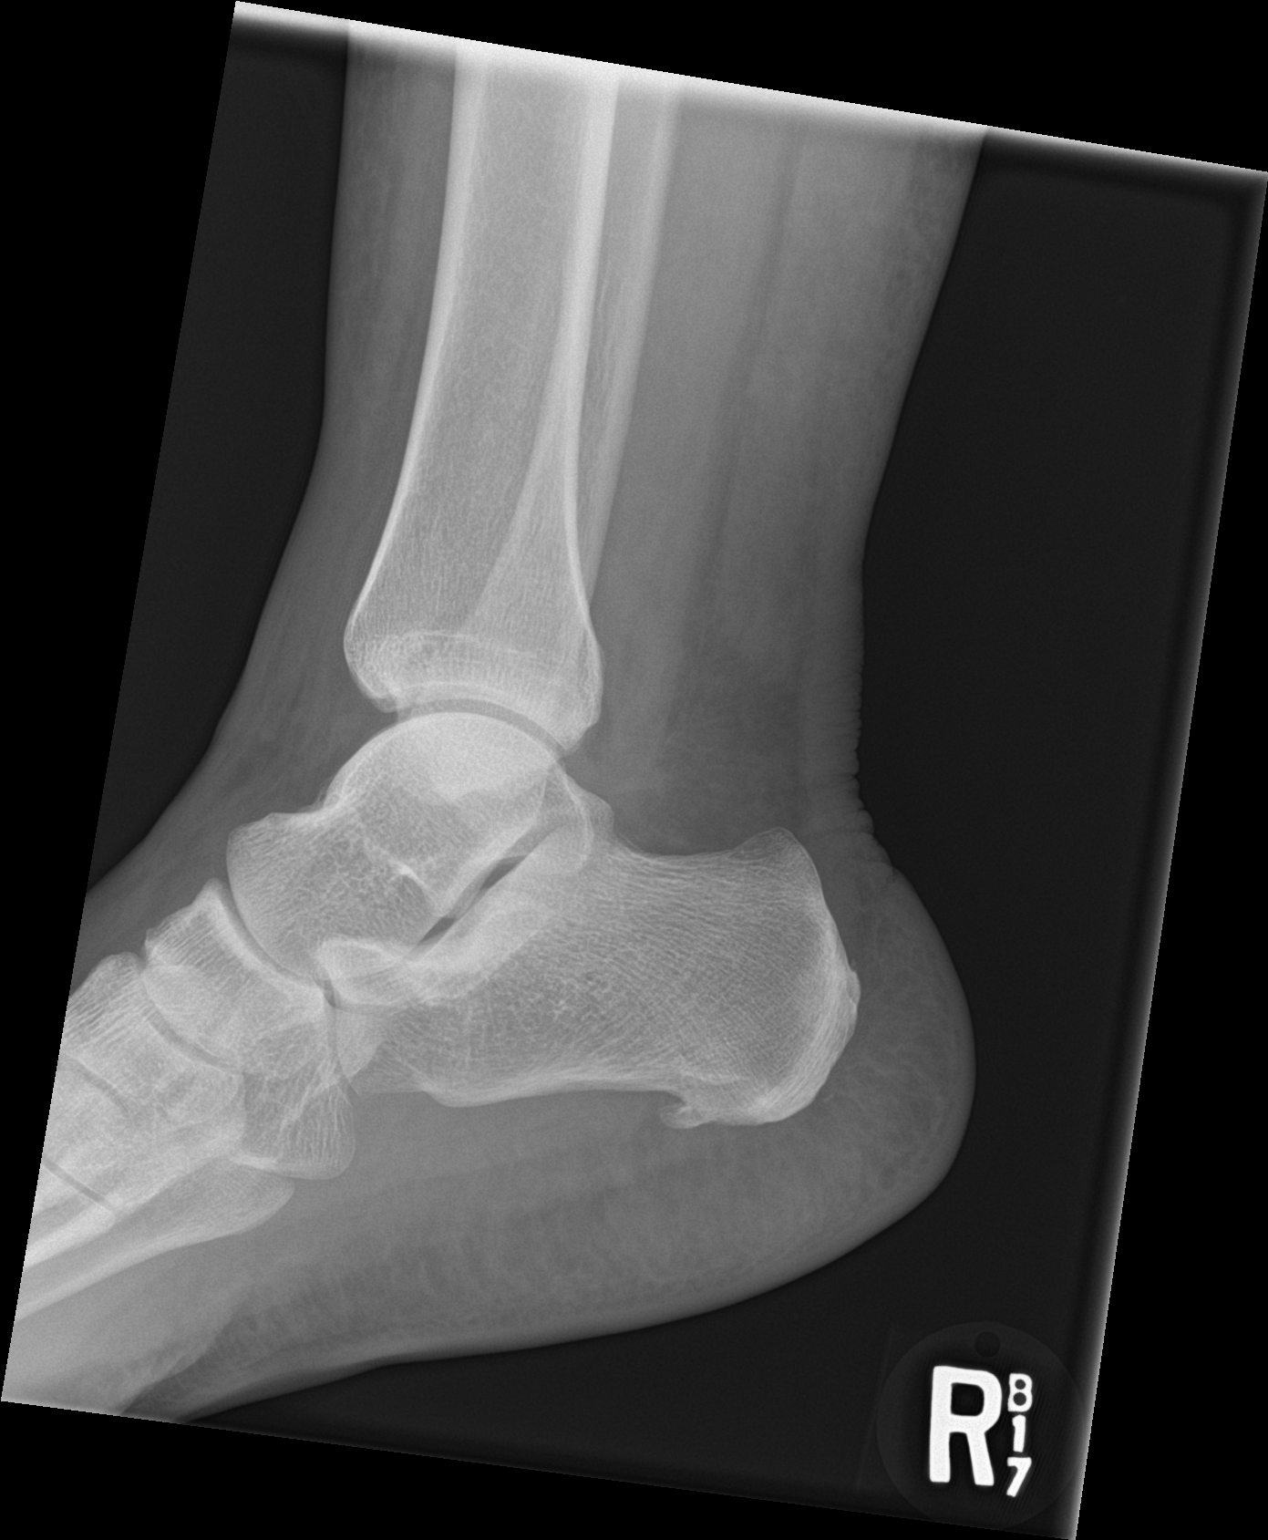

[3 of 3 positions shown; findings below may reference images not displayed]

FINDINGS: Scattered soft tissue swelling.

Osseous mineralization normal.

Joint spaces preserved.

Small plantar calcaneal spur.

No acute fracture, dislocation, or bone destruction.
IMPRESSION: Mild soft tissue swelling without acute bony abnormalities.

## 2019-05-28 ENCOUNTER — Emergency Department
Admission: EM | Admit: 2019-05-28 | Discharge: 2019-05-28 | Disposition: A | Discharge status: Home / Self Care | Payer: BC Managed Care – PPO | Attending: Emergency Medicine | Admitting: Emergency Medicine

## 2019-05-28 ENCOUNTER — Other Ambulatory Visit: Payer: Self-pay

## 2019-05-28 ENCOUNTER — Encounter: Payer: Self-pay | Admitting: *Deleted

## 2019-05-28 ENCOUNTER — Emergency Department: Payer: BC Managed Care – PPO

## 2019-05-28 DIAGNOSIS — Y92512 Supermarket, store or market as the place of occurrence of the external cause: Secondary | ICD-10-CM | POA: Diagnosis not present

## 2019-05-28 DIAGNOSIS — W010XXA Fall on same level from slipping, tripping and stumbling without subsequent striking against object, initial encounter: Secondary | ICD-10-CM | POA: Diagnosis not present

## 2019-05-28 DIAGNOSIS — M7918 Myalgia, other site: Secondary | ICD-10-CM

## 2019-05-28 DIAGNOSIS — Y999 Unspecified external cause status: Secondary | ICD-10-CM | POA: Diagnosis not present

## 2019-05-28 DIAGNOSIS — Y939 Activity, unspecified: Secondary | ICD-10-CM | POA: Diagnosis not present

## 2019-05-28 DIAGNOSIS — M25531 Pain in right wrist: Secondary | ICD-10-CM | POA: Insufficient documentation

## 2019-05-28 DIAGNOSIS — J45909 Unspecified asthma, uncomplicated: Secondary | ICD-10-CM | POA: Insufficient documentation

## 2019-05-28 DIAGNOSIS — M25561 Pain in right knee: Secondary | ICD-10-CM | POA: Diagnosis not present

## 2019-05-28 DIAGNOSIS — M545 Low back pain: Secondary | ICD-10-CM | POA: Diagnosis present

## 2019-05-28 DIAGNOSIS — M791 Myalgia, unspecified site: Secondary | ICD-10-CM | POA: Diagnosis not present

## 2019-05-28 DIAGNOSIS — Z79899 Other long term (current) drug therapy: Secondary | ICD-10-CM | POA: Diagnosis not present

## 2019-05-28 DIAGNOSIS — W19XXXA Unspecified fall, initial encounter: Secondary | ICD-10-CM

## 2019-05-28 MED ORDER — CYCLOBENZAPRINE HCL 10 MG PO TABS: 10.0000 mg | ORAL_TABLET | Freq: Three times a day (TID) | ORAL | 0 refills | Status: AC | PRN

## 2019-05-28 MED ORDER — TRAMADOL HCL 50 MG PO TABS: 50.0000 mg | ORAL_TABLET | Freq: Four times a day (QID) | ORAL | 0 refills | Status: AC | PRN

## 2019-05-28 NOTE — ED Provider Notes (Signed)
[facility] Emergency Department Provider Note   ____________________________________________   First MD Initiated Contact with Patient 05/28/19 1359     (approximate)  I have reviewed the triage vital signs and the nursing notes.   HISTORY  Chief Complaint Fall    HPI Debbie Leon is a 59 y.o. female patient complaining of low back pain, right wrist pain, and right knee pain secondary to a slip and fall at a fast food store today.  Patient denies head injury or LOC.  Patient denies chest or abdominal pain.  Patient denies radicular component to her back pain.  Patient rates her pain as a 5/10.  Patient described pain as "achy".  No palliative measure prior to arrival.    Patient discussed Past Medical History:  Diagnosis Date  . Asthma     There are no problems to display for this patient.   Past Surgical History:  Procedure Laterality Date  . DENTAL SURGERY      Prior to Admission medications   Medication Sig Start Date End Date Taking? Authorizing Provider  albuterol (PROVENTIL HFA;VENTOLIN HFA) 108 (90 Base) MCG/ACT inhaler Inhale 2 puffs into the lungs every 6 (six) hours as needed for wheezing or shortness of breath. 05/07/17   Lannie Fields, PA-C  cetirizine (ZYRTEC) 10 MG tablet Take 1 tablet (10 mg total) by mouth daily. 09/14/15   Cuthriell, Charline Bills, PA-C  cyclobenzaprine (FLEXERIL) 10 MG tablet Take 1 tablet (10 mg total) by mouth 3 (three) times daily as needed. 05/28/19   Sable Feil, PA-C  fluticasone (FLONASE) 50 MCG/ACT nasal spray Place 1 spray into both nostrils 2 (two) times daily. 09/14/15   Cuthriell, Charline Bills, PA-C  ibuprofen (ADVIL,MOTRIN) 600 MG tablet Take 1 tablet (600 mg total) every 6 (six) hours as needed by mouth. 12/23/16   Laban Emperor, PA-C  Norgestim-Eth Estrad Triphasic (ORTHO TRI-CYCLEN LO PO) Take 1 tablet by mouth daily.    [provider]  ondansetron (ZOFRAN) 4 MG tablet Take 1 tablet (4  mg total) by mouth every 8 (eight) hours as needed for nausea or vomiting. 02/26/16   Schuyler Amor, MD  traMADol (ULTRAM) 50 MG tablet Take 1 tablet (50 mg total) by mouth every 6 (six) hours as needed for moderate pain. 05/28/19   Sable Feil, PA-C    Allergies Dye fdc red [red dye] and Shrimp [shellfish allergy]  No family history on file.  Social History Social History   Tobacco Use  . Smoking status: Never Smoker  . Smokeless tobacco: Never Used  Substance Use Topics  . Alcohol use: No  . Drug use: No    Review of Systems Constitutional: No fever/chills Eyes: No visual changes. ENT: No sore throat. Cardiovascular: Denies chest pain. Respiratory: Denies shortness of breath. Gastrointestinal: No abdominal pain.  No nausea, no vomiting.  No diarrhea.  No constipation. Genitourinary: Negative for dysuria. Musculoskeletal: Positive for back pain, right wrist pain, and right knee pain. Skin: Negative for rash. Neurological: Negative for headaches, focal weakness or numbness. Allergic/Immunilogical: Red dye and shrimp ____________________________________________   PHYSICAL EXAM:  VITAL SIGNS: ED Triage Vitals  Enc Vitals Group     BP 05/28/19 1318 137/73     Pulse Rate 05/28/19 1318 70     Resp 05/28/19 1318 16     Temp 05/28/19 1318 97.6 F (36.4 C)     Temp Source 05/28/19 1318 Oral     SpO2 05/28/19 1318 99 %  Weight 05/28/19 1319 195 lb (88.5 kg)     Height 05/28/19 1319 5\' 6"  (1.676 m)     Head Circumference --      Peak Flow --      Pain Score --      Pain Loc --      Pain Edu? --      Excl. in GC? --    Constitutional: Alert and oriented. Well appearing and in no acute distress. Neck: No stridor.   Hematological/Lymphatic/Immunilogical: No cervical lymphadenopathy. Cardiovascular: Normal rate, regular rhythm. Grossly normal heart sounds.  Good peripheral circulation. Respiratory: Normal respiratory effort.  No retractions. Lungs  CTAB. Gastrointestinal: Soft and nontender. No distention. No abdominal bruits. No CVA tenderness. Genitourinary: Deferred Musculoskeletal: No obvious deformity to the lumbar spine, right wrist, or right knee.   Neurologic:  Normal speech and language. No gross focal neurologic deficits are appreciated. No gait instability. Skin:  Skin is warm, dry and intact. No rash noted.  No abrasions or ecchymosis. Psychiatric: Mood and affect are normal. Speech and behavior are normal.  ____________________________________________   LABS (all labs ordered are listed, but only abnormal results are displayed)  Labs Reviewed - No data to display ____________________________________________  EKG   ____________________________________________  RADIOLOGY  ED MD interpretation:    Official radiology report(s): DG Lumbar Spine 2-3 Views  Result Date: 05/28/2019 CLINICAL DATA:  Fall, low back pain EXAM: LUMBAR SPINE - 2-3 VIEW COMPARISON:  10/14/2007 FINDINGS: Normal lumbar lordosis. No evidence of fracture or dislocation. Vertebral body heights are maintained. Degenerative changes with anterior osteophytosis at L5-S1. Visualized bony pelvis appears intact. IMPRESSION: Negative. Electronically Signed   By: 10/16/2007 M.D.   On: 05/28/2019 16:18   DG Wrist Complete Right  Result Date: 05/28/2019 CLINICAL DATA:  Fall, right wrist pain EXAM: RIGHT WRIST - COMPLETE 3+ VIEW COMPARISON:  None. FINDINGS: No fracture or dislocation is seen. The joint spaces are preserved. The visualized soft tissues are unremarkable. IMPRESSION: Negative. Electronically Signed   By: 07/28/2019 M.D.   On: 05/28/2019 16:19   DG Knee Complete 4 Views Right  Result Date: 05/28/2019 CLINICAL DATA:  Fall, right knee pain EXAM: RIGHT KNEE - COMPLETE 4+ VIEW COMPARISON:  None. FINDINGS: No fracture or dislocation is seen. Very mild degenerative changes with sharpening of the tibial spines and osteophytosis in the medial  and patellofemoral compartments. Visualized soft tissues are within normal limits. No definite suprapatellar knee joint effusion. IMPRESSION: Negative. Electronically Signed   By: 07/28/2019 M.D.   On: 05/28/2019 16:20    ____________________________________________   PROCEDURES  Procedure(s) performed (including Critical Care):  Procedures   ____________________________________________   INITIAL IMPRESSION / ASSESSMENT AND PLAN / ED COURSE  As part of my medical decision making, I reviewed the following data within the electronic MEDICAL RECORD NUMBER     Patient presents with low back pain, right wrist pain, and right knee pain secondary to a fall at a fast food store.  Discussed x-ray findings with patient consistent with degenerative changes.  Patient physical exam is consistent with muscle skeletal pain.  Patient given discharge care instruction advised take medication as directed.  Patient advised of drowsy effects of medications.  Follow-up with PCP.    Debbie Leon was evaluated in Emergency Department on 05/28/2019 for the symptoms described in the history of present illness. She was evaluated in the context of the global COVID-19 pandemic, which necessitated consideration that the patient might be at risk  for infection with the SARS-CoV-2 virus that causes COVID-19. Institutional protocols and algorithms that pertain to the evaluation of patients at risk for COVID-19 are in a state of rapid change based on information released by regulatory bodies including the CDC and federal and state organizations. These policies and algorithms were followed during the patient's care in the ED.       ____________________________________________   FINAL CLINICAL IMPRESSION(S) / ED DIAGNOSES  Final diagnoses:  Fall, initial encounter  Musculoskeletal pain     ED Discharge Orders         Ordered    cyclobenzaprine (FLEXERIL) 10 MG tablet  3 times daily PRN     05/28/19 1631     traMADol (ULTRAM) 50 MG tablet  Every 6 hours PRN     05/28/19 1631           Note:  This document was prepared using Dragon voice recognition software and may include unintentional dictation errors.    Joni Reining, PA-C 05/28/19 1635    Sharman Cheek, MD 06/03/19 [phone]

## 2019-05-28 NOTE — ED Notes (Signed)
See triage note  Presents with pain to right wrist ,knee and ankle  States she had a near fall this am developed pain after

## 2019-05-28 NOTE — ED Notes (Signed)
Pt given cola, crackers, and peanut butter

## 2019-05-28 NOTE — Discharge Instructions (Signed)
Follow discharge care instructions and take medication as directed.  Be advised medication may cause drowsiness

## 2019-05-28 NOTE — ED Triage Notes (Signed)
Patient states she nearly fell this morning at [facility] and while catching herself, injured hier right wrist and right knee and ankle.

## 2020-04-30 ENCOUNTER — Other Ambulatory Visit: Payer: Self-pay

## 2020-04-30 ENCOUNTER — Encounter (HOSPITAL_COMMUNITY): Payer: Self-pay

## 2020-04-30 ENCOUNTER — Emergency Department (HOSPITAL_COMMUNITY)
Admission: EM | Admit: 2020-04-30 | Discharge: 2020-04-30 | Disposition: A | Discharge status: Home / Self Care | Payer: 59 | Attending: Emergency Medicine | Admitting: Emergency Medicine

## 2020-04-30 DIAGNOSIS — W5911XA Bitten by nonvenomous snake, initial encounter: Secondary | ICD-10-CM | POA: Diagnosis not present

## 2020-04-30 DIAGNOSIS — Y9222 Religious institution as the place of occurrence of the external cause: Secondary | ICD-10-CM | POA: Insufficient documentation

## 2020-04-30 DIAGNOSIS — S81851A Open bite, right lower leg, initial encounter: Secondary | ICD-10-CM | POA: Diagnosis not present

## 2020-04-30 DIAGNOSIS — J45909 Unspecified asthma, uncomplicated: Secondary | ICD-10-CM | POA: Diagnosis not present

## 2020-04-30 NOTE — ED Provider Notes (Signed)
[facility] EMERGENCY DEPARTMENT Provider Note  CSN: [id number] Arrival date & time: 04/30/20 1935    History Chief Complaint  Patient presents with  . Snake Bite    HPI  Debbie Leon is a 60 y.o. female reports about a week ago she was walking behind her husband through the woods to go fishing when she felt a stinging pain in the back of her R leg. She thought it may have been a briar but when she looked she didn't see anything. Last night she saw two spots on the back of her R leg and then at church today her pastor was preaching about snakes and she began to get worries. She has not had any other acute symptoms. She has chronic R knee swelling but nothing out of the ordinary. Mild R groin aching, but no bruising, bleeding or leg swelling. No fever.    Past Medical History:  Diagnosis Date  . Asthma     Past Surgical History:  Procedure Laterality Date  . DENTAL SURGERY      No family history on file.  Social History   Tobacco Use  . Smoking status: Never Smoker  . Smokeless tobacco: Never Used  Substance Use Topics  . Alcohol use: No  . Drug use: No     Home Medications Prior to Admission medications   Medication Sig Start Date End Date Taking? Authorizing Provider  albuterol (PROVENTIL HFA;VENTOLIN HFA) 108 (90 Base) MCG/ACT inhaler Inhale 2 puffs into the lungs every 6 (six) hours as needed for wheezing or shortness of breath. 05/07/17   Orvil Feil, PA-C  cetirizine (ZYRTEC) 10 MG tablet Take 1 tablet (10 mg total) by mouth daily. 09/14/15   Cuthriell, Delorise Royals, PA-C  cyclobenzaprine (FLEXERIL) 10 MG tablet Take 1 tablet (10 mg total) by mouth 3 (three) times daily as needed. 05/28/19   Joni Reining, PA-C  fluticasone (FLONASE) 50 MCG/ACT nasal spray Place 1 spray into both nostrils 2 (two) times daily. 09/14/15   Cuthriell, Delorise Royals, PA-C  ibuprofen (ADVIL,MOTRIN) 600 MG tablet Take 1 tablet (600 mg total) every 6 (six) hours as needed by mouth.  12/23/16   Enid Derry, PA-C  Norgestim-Eth Estrad Triphasic (ORTHO TRI-CYCLEN LO PO) Take 1 tablet by mouth daily.    [provider]  ondansetron (ZOFRAN) 4 MG tablet Take 1 tablet (4 mg total) by mouth every 8 (eight) hours as needed for nausea or vomiting. 02/26/16   Jeanmarie Plant, MD  traMADol (ULTRAM) 50 MG tablet Take 1 tablet (50 mg total) by mouth every 6 (six) hours as needed for moderate pain. 05/28/19   Joni Reining, PA-C     Allergies    Dye fdc red [red dye] and Shrimp [shellfish allergy]   Review of Systems   Review of Systems A comprehensive review of systems was completed and negative except as noted in HPI.    Physical Exam BP 138/76   Pulse 78   Temp 97.8 F (36.6 C) (Oral)   Wt 90.7 kg   LMP 02/12/2016   SpO2 100%   BMI 32.28 kg/m   Physical Exam Vitals and nursing note reviewed.  Constitutional:      Appearance: Normal appearance.  HENT:     Head: Normocephalic and atraumatic.     Nose: Nose normal.     Mouth/Throat:     Mouth: Mucous membranes are moist.  Eyes:     Extraocular Movements: Extraocular movements intact.  Conjunctiva/sclera: Conjunctivae normal.  Cardiovascular:     Rate and Rhythm: Normal rate.  Pulmonary:     Effort: Pulmonary effort is normal.     Breath sounds: Normal breath sounds.  Abdominal:     General: Abdomen is flat.     Palpations: Abdomen is soft.     Tenderness: There is no abdominal tenderness.  Musculoskeletal:        General: No swelling or tenderness. Normal range of motion.     Cervical back: Neck supple.     Comments: Two small sounds on back of L leg could be remote snake bites, see photo below. No other concerning findings of significant snake bite toxicity.   Skin:    General: Skin is warm and dry.     Findings: No bruising or rash.  Neurological:     General: No focal deficit present.     Mental Status: She is alert.  Psychiatric:        Mood and Affect: Mood normal.        ED  Results / Procedures / Treatments   Labs (all labs ordered are listed, but only abnormal results are displayed) Labs Reviewed - No data to display  EKG None  Radiology No results found.  Procedures Procedures  Medications Ordered in the ED Medications - No data to display   MDM Rules/Calculators/A&P MDM Patient with possible remote snake bite about a week ago. No signs of significant toxicity and no other ED workup is necessary. TDAP is UTD.  ED Course  I have reviewed the triage vital signs and the nursing notes.  Pertinent labs & imaging results that were available during my care of the patient were reviewed by me and considered in my medical decision making (see chart for details).     Final Clinical Impression(s) / ED Diagnoses Final diagnoses:  Snake bite, initial encounter    Rx / DC Orders ED Discharge Orders    None       Pollyann Savoy, MD 04/30/20 2022

## 2020-04-30 NOTE — ED Triage Notes (Signed)
Pt was bitten by a "snake" one week ago. Pt has two puncture marks consistent with snake bite on right lower leg. Pt denies fever. Pt did not visualize the snake at time of bite.

## 2020-11-26 IMAGING — CR DG WRIST COMPLETE 3+V*R*
1 series · 4 of 4 positions shown · non-contrast
Comparison: None.

CLINICAL DATA: Fall, right wrist pain

EXAM:
RIGHT WRIST - COMPLETE 3+ VIEW

[Series 1: dg wrist complete right · 0.14mm/px · 4 of 4 slices shown]
[im 1/4]
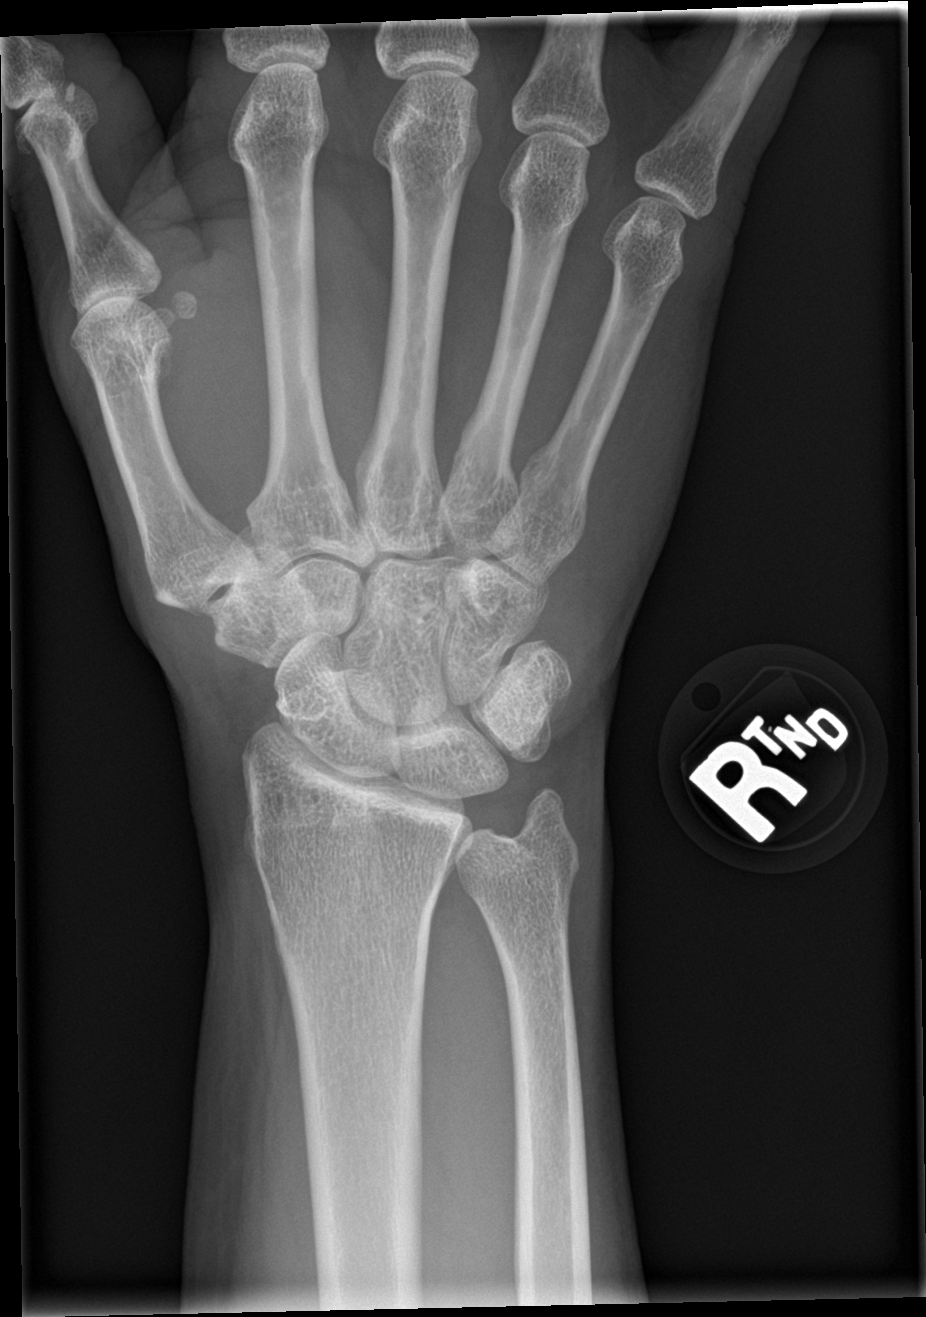
[im 2/4]
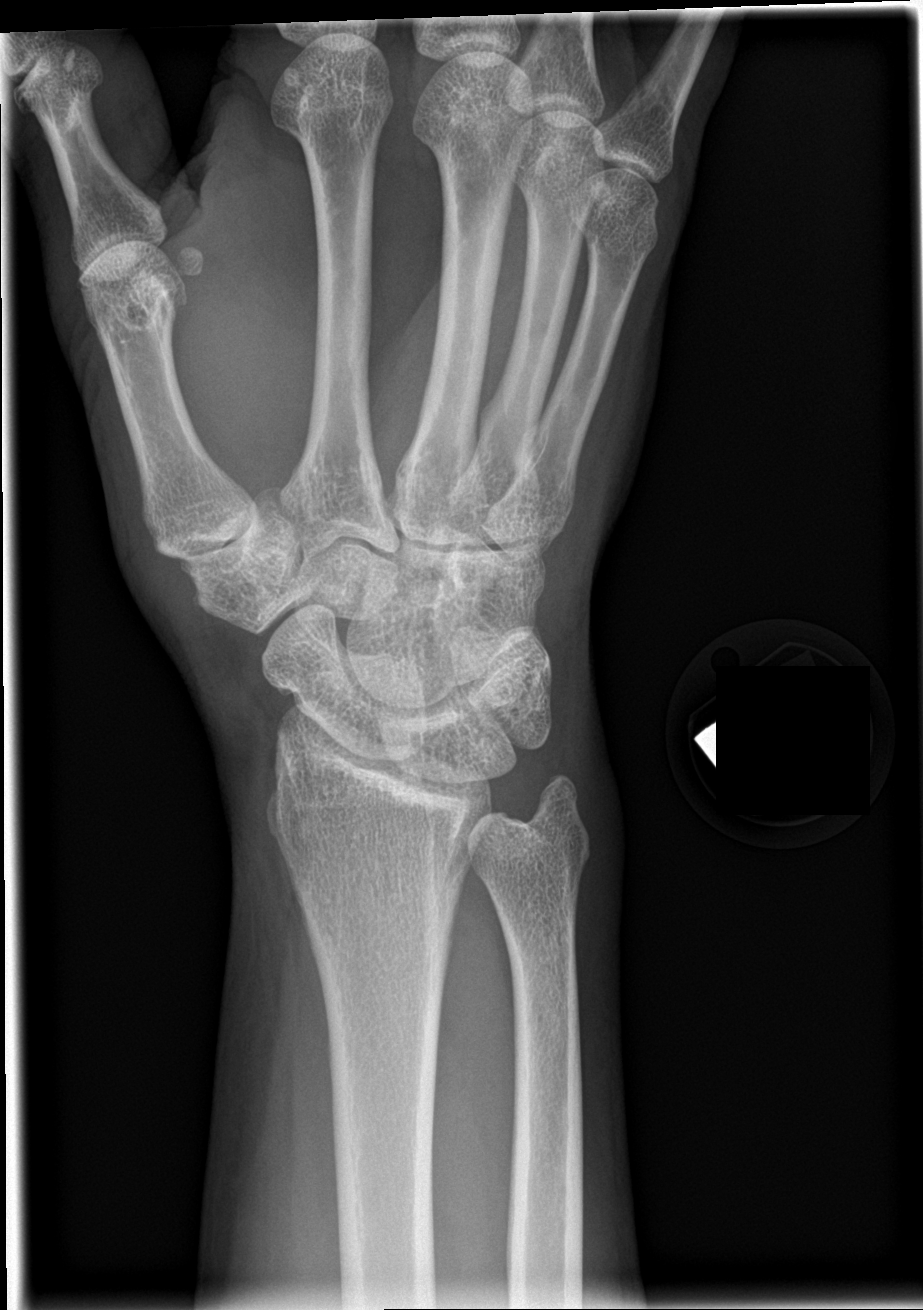
[im 3/4]
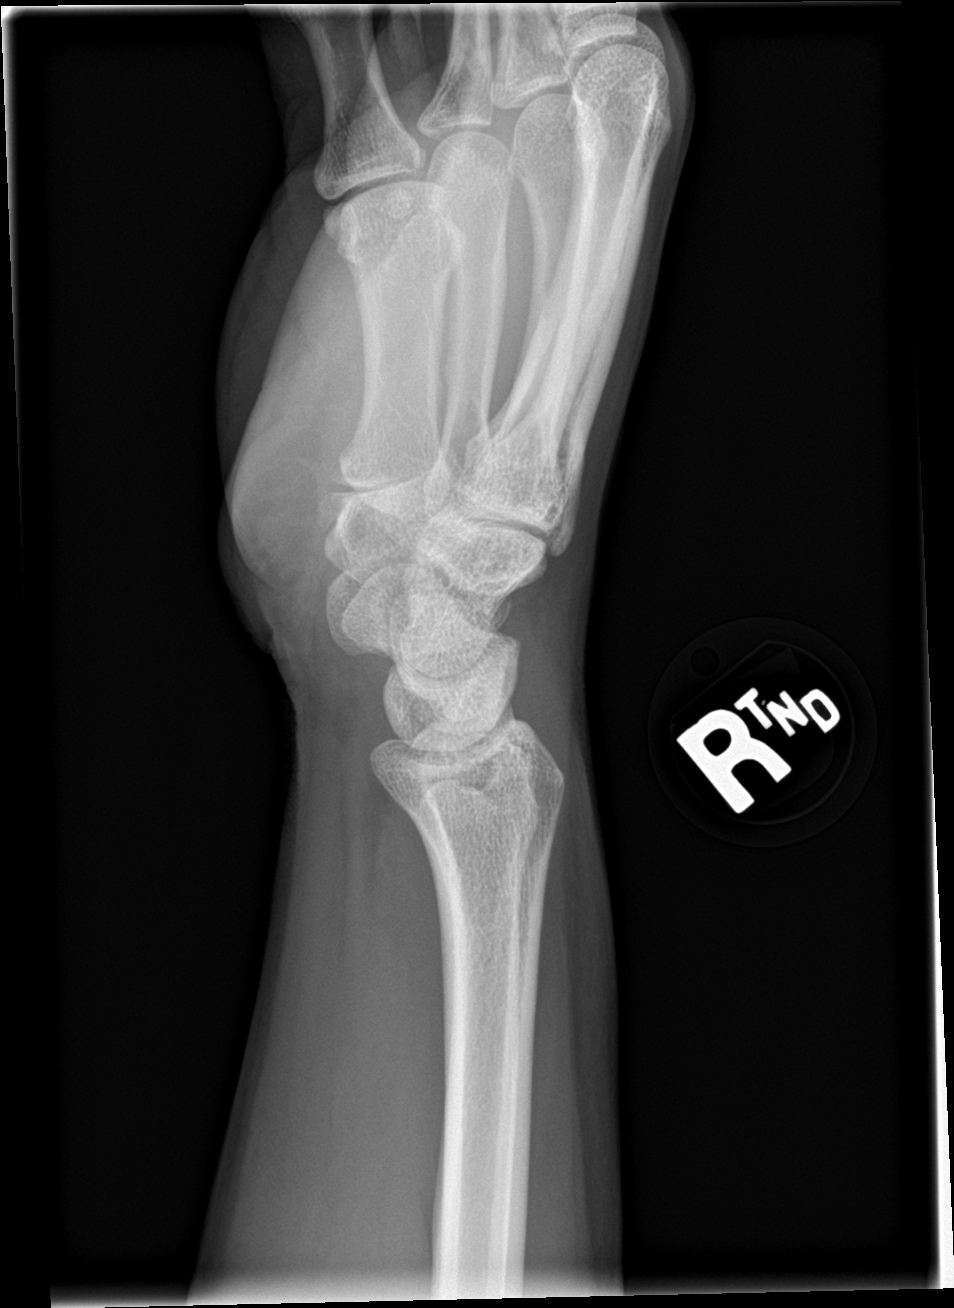
[im 4/4]
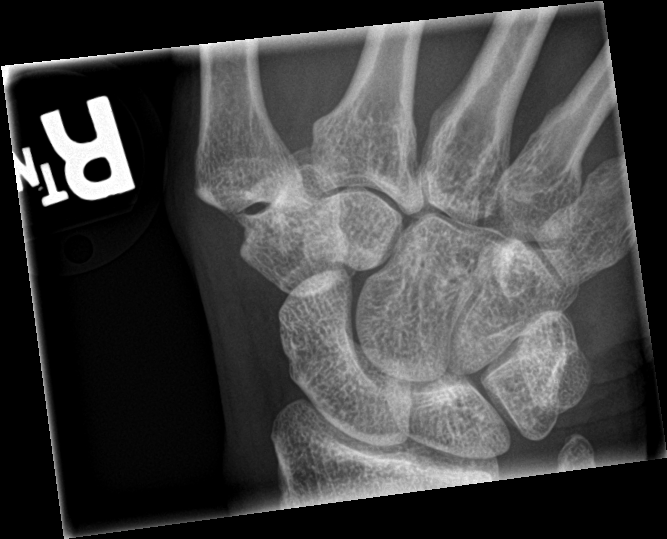

[4 of 4 positions shown; findings below may reference images not displayed]

FINDINGS: No fracture or dislocation is seen.

The joint spaces are preserved.

The visualized soft tissues are unremarkable.
IMPRESSION: Negative.

## 2022-04-16 ENCOUNTER — Ambulatory Visit: Payer: Self-pay | Admitting: *Deleted

## 2022-04-16 NOTE — Telephone Encounter (Signed)
constipation/tingling   Patient called in has constipation and bloating, no appt until April and May for New patient, for [facility] and open scheduling and some tingling in leg in the mornings      Attempted to reach pt, VM full, unable to leave message to [facility]

## 2022-04-16 NOTE — Telephone Encounter (Signed)
Message from [place] sent at 04/16/2022 12:28 PM EST  Summary: constipation/tingling   Patient called in has constipation and bloating, no appt until April and May for New patient, for [facility] and open scheduling and some tingling in leg in the mornings        Third attempt to reach pt. VM was full, unable to LM.  Reason for Disposition . Third attempt to contact caller AND no contact made. Phone number verified.  Protocols used: No Contact or Duplicate Contact Call-A-AH

## 2022-04-16 NOTE — Telephone Encounter (Signed)
Second attempt to reach pt, VM full

## 2022-05-16 ENCOUNTER — Encounter: Payer: Self-pay | Admitting: *Deleted

## 2022-05-29 ENCOUNTER — Telehealth: Payer: Self-pay

## 2022-05-29 NOTE — Telephone Encounter (Signed)
Pt left message to schedule colonoscopy would like a call back 

## 2022-05-30 NOTE — Telephone Encounter (Signed)
Tried to call patient but voice mail box is full. 

## 2022-05-31 NOTE — Telephone Encounter (Signed)
Tried to call patient but could not leave voicemail since it is full

## 2022-06-03 NOTE — Telephone Encounter (Signed)
Tried to call patient but could not leave message since voicemail is full 

## 2022-11-13 ENCOUNTER — Ambulatory Visit: Payer: Self-pay | Admitting: *Deleted

## 2022-11-13 NOTE — Telephone Encounter (Signed)
Reason for Disposition . [1] Using Care Advice for ingrown toenail > 7 days AND [2] not improved  Answer Assessment - Initial Assessment Questions 1. LOCATION: "Which toe?"      Right Great toe 2. APPEARANCE: "What does it look like?"      Nail has black 3. ONSET: "When did it start?"      1 month ago 4. PAIN: "Is there any pain?" If Yes, ask: "How bad is the pain?"   (Scale 1-10; or mild, moderate, severe)    - NONE (0): none     - MILD (1-3): doesn't interfere with normal activities    - MODERATE (4-7): interferes with normal activities or awakens from sleep    - SEVERE (8-10): excruciating pain, unable to do any normal activities     None 5. REDNESS: "Is there any redness of the skin?" If Yes, ask: "How much of the toe is red?"     Small amount 6. OTHER SYMPTOMS: "Do you have any other symptoms?" (e.g., chills, fever, red streak up foot)     Itching 7. PREGNANCY: "Is there any chance you are pregnant?" "When was your last menstrual period?"     No  Protocols used: Toenail - Ingrown-A-AH

## 2022-11-13 NOTE — Telephone Encounter (Signed)
Attempted to return her call however her voice mailbox is full so unable to leave a message. She is set up for a new pt appt on 01/02/2023 with Dr. Payton Mccallum at [facility].

## 2022-11-13 NOTE — Telephone Encounter (Signed)
Pt has new pt appt with [facility]  01/02/2023 at 8:40 with Dr. Payton Mccallum.  but has a toe fungus and wants to know where she can go until her appt.  Attempted to return her call to let her know she can go to her prior PCP until established with [facility] or the urgent care but her voice mailbox was full so unable to leave a message.

## 2022-11-13 NOTE — Telephone Encounter (Signed)
  Chief Complaint: Right Great toe is starting to grow in, has "fungus as well." Has black spots to nail. Symptoms: Above Frequency: 1 month ago Pertinent Negatives: Patient denies drainage Disposition: [] ED /[x] Urgent Care (no appt availability in office) / [] Appointment(In office/virtual)/ []  [place] [facility]/ [] Home Care/ [] Refused Recommended Disposition /[] [place] [facility]/ []  Follow-up with PCP Additional Notes: Pt. Agrees with UC.

## 2022-12-19 ENCOUNTER — Ambulatory Visit: Payer: Self-pay

## 2022-12-19 NOTE — Telephone Encounter (Signed)
  Chief Complaint: bloating Symptoms: bloating, belching, abdominal distention, hard stools  Frequency: 1 week or more  Pertinent Negatives: NA Disposition: [] ED /[x] Urgent Care (no appt availability in office) / [] Appointment(In office/virtual)/ []  [place] [facility]/ [] Home Care/ [] Refused Recommended Disposition /[] [place] [facility]/ []  Follow-up with PCP Additional Notes: pt is currently out of town. States she has had constipation issues for a while. Pt states she tried a Probiotic one time and it helped but doesn't take them daily. Recommended pt be seen and can go to UC, pt is currently in [place] but will go to UC when she comes back home Sunday. Advised pt of care advice and to try daily Probiotic and fiber to help with bowels. Pt verbalized understanding.   Summary: bloating & burping   Patient has called and states she is having trouble with digestive system, bloating and burping, alot of burping, but no abdominal pain or any other pain, she has NP scheduled with Dr Jacquenette Shone on 01/14/2023. She was wanting to know if something could be called in for this? Patient's callback # [phone]         Reason for Disposition  [1] MILD abdomen pain (e.g., does not interfere with normal activities) AND [2] pain comes and goes (cramps) AND [3] present > 48 hours  (Exception: Mild abdomen pain is a chronic symptom, recurrent or ongoing AND present > 4 weeks.)  Answer Assessment - Initial Assessment Questions 1. SYMPTOM: "What's the main symptom you're concerned about?" (e.g., abdomen bloating, swelling)     Bloating  2. ONSET: "When did sx  start?"     1 week  3. SEVERITY: "How bad is the bloating or swelling?"    - BLOATING: Feels gassy or bloated. No visible swelling.     - MILD SWELLING: Feels gassy or bloated. Abdomen looks mildly distended or swollen.    - MODERATE - SEVERE SWELLING: Abdomen looks very distended or swollen.      Mild  4. ABDOMEN PAIN:  "Is there  any abdomen pain?" If Yes, ask: "How bad is the pain?"  (e.g., Scale 1-10; mild, moderate, or severe)   - NONE (0): No pain.   - MILD (1-3): Doesn't interfere with normal activities, abdomen soft and not tender to touch.    - MODERATE (4-7): Interferes with normal activities or awakens from sleep, abdomen tender to touch.    - SEVERE (8-10): Excruciating pain, doubled over, unable to do any normal activities.       no 5. RELIEVING AND AGGRAVATING FACTORS: "What makes it better or worse?" (e.g., certain foods, lactose, medicines)     Eating makes worse  6. GI HISTORY: "Do you have any history of stomach or intestine problems?" (e.g., bowel obstruction, cancer, irritable bowel)  no 8. OTHER SYMPTOMS: "Do you have any other symptoms?" (e.g., belching, blood in stool, breathing difficulty, constipation, diarrhea, fever, passing gas, vomiting, weight loss, white of eyes have turned yellow)     Constipated, LBM yesterday  Protocols used: Abdomen Bloating and Swelling-A-AH

## 2023-01-02 ENCOUNTER — Ambulatory Visit: Payer: Self-pay | Admitting: Family Medicine

## 2023-01-14 ENCOUNTER — Ambulatory Visit: Payer: Self-pay | Admitting: Family Medicine

## 2023-01-14 NOTE — Progress Notes (Deleted)
New patient visit   Patient: Debbie Leon   DOB: 11-08-1960   62 y.o. Female  MRN: [id number] Visit Date: 01/14/2023  Today's healthcare provider: Sherlyn Hay, DO   No chief complaint on file.  Subjective    Debbie Leon is a 62 y.o. female who presents today as a new patient to establish care.  HPI  Previously on hydrochlorothiazide 25 mg daily    ***  Past Medical History:  Diagnosis Date   Asthma    Past Surgical History:  Procedure Laterality Date   DENTAL SURGERY     No family status information on file.   No family history on file. Social History   Socioeconomic History   Marital status: Divorced    Spouse name: Not on file   Number of children: Not on file   Years of education: Not on file   Highest education level: Not on file  Occupational History   Not on file  Tobacco Use   Smoking status: Never   Smokeless tobacco: Never  Substance and Sexual Activity   Alcohol use: No   Drug use: No   Sexual activity: Not on file  Other Topics Concern   Not on file  Social History Narrative   Not on file   Social Determinants of Health   Financial Resource Strain: Not on file  Food Insecurity: Not on file  Transportation Needs: Not on file  Physical Activity: Not on file  Stress: Not on file  Social Connections: Not on file   Outpatient Medications Prior to Visit  Medication Sig   albuterol (PROVENTIL HFA;VENTOLIN HFA) 108 (90 Base) MCG/ACT inhaler Inhale 2 puffs into the lungs every 6 (six) hours as needed for wheezing or shortness of breath.   cetirizine (ZYRTEC) 10 MG tablet Take 1 tablet (10 mg total) by mouth daily.   cyclobenzaprine (FLEXERIL) 10 MG tablet Take 1 tablet (10 mg total) by mouth 3 (three) times daily as needed.   fluticasone (FLONASE) 50 MCG/ACT nasal spray Place 1 spray into both nostrils 2 (two) times daily.   ibuprofen (ADVIL,MOTRIN) 600 MG tablet Take 1 tablet (600 mg total) every 6 (six) hours as needed by mouth.    Norgestim-Eth Estrad Triphasic (ORTHO TRI-CYCLEN LO PO) Take 1 tablet by mouth daily.   ondansetron (ZOFRAN) 4 MG tablet Take 1 tablet (4 mg total) by mouth every 8 (eight) hours as needed for nausea or vomiting.   traMADol (ULTRAM) 50 MG tablet Take 1 tablet (50 mg total) by mouth every 6 (six) hours as needed for moderate pain.   No facility-administered medications prior to visit.   Allergies  Allergen Reactions   Dye Fdc Red [Red Dye #40 (Allura Red)] Hives   Shrimp [Shellfish Allergy]     Allergy doc told her allergic to it, but has never had problem with shrimp     There is no immunization history on file for this patient.  Health Maintenance  Topic Date Due   HIV Screening  Never done   Hepatitis C Screening  Never done   DTaP/Tdap/Td (1 - Tdap) Never done   Cervical Cancer Screening (HPV/Pap Cotest)  Never done   Colonoscopy  Never done   MAMMOGRAM  Never done   Zoster Vaccines- Shingrix (1 of 2) Never done   INFLUENZA VACCINE  Never done   COVID-19 Vaccine (1 - 2023-24 season) Never done   HPV VACCINES  Aged Out    Patient Care Team: [place],  Fernand Parkins, FNP as PCP - General (Family Medicine)  Review of Systems  {Insert previous labs (optional):23779} {See past labs  Heme  Chem  Endocrine  Serology  Results Review (optional):1}   Objective    LMP 02/12/2016  {Insert last BP/Wt (optional):23777}{See vitals history (optional):1}   Physical Exam   Depression Screen     No data to display         No results found for any visits on 01/14/23.  Assessment & Plan     There are no diagnoses linked to this encounter.   ***  No follow-ups on file.     I discussed the assessment and treatment plan with the patient  The patient was provided an opportunity to ask questions and all were answered. The patient agreed with the plan and demonstrated an understanding of the instructions.   The patient was advised to call back or seek an in-person  evaluation if the symptoms worsen or if the condition fails to improve as anticipated.    Sherlyn Hay, DO  [facility] [phone] (phone) [phone] (fax)  [facility]

## 2023-04-07 ENCOUNTER — Ambulatory Visit: Payer: Self-pay | Admitting: *Deleted

## 2023-04-07 NOTE — Telephone Encounter (Signed)
 Message from [place] F sent at 04/07/2023  9:40 AM EST  Summary: Ear pain, ear ache   Pt is calling in because she believes she has an ear infection. Pt says her ear is extremely sore and she can't chew. Pt says it's making her temple hurt as well. Pt wants to know if she can get an antibiotic or something sent in. Pt has a new pt appointment 04/16/23.          Call History  Contact Date/Time Type Contact Phone/Fax By  04/07/2023 09:38 AM EST Phone (Incoming) [place], Evora (Self) [phone] Judie Petit) Colon Flattery M   Reason for Disposition  [1] SEVERE pain AND [2] not improved 2 hours after taking analgesic medication (e.g., ibuprofen or acetaminophen)    Referred to the urgent care since not an established pt with [facility].  Answer Assessment - Initial Assessment Questions 1. LOCATION: "Which ear is involved?"    I returned her call.   She is not an established pt with [facility].   Has new pt appt on 04/16/2023.    Left ear is hurting so bad.   I can't hardly chew it hurts so bad. 2. ONSET: "When did the ear start hurting"      2 weeks ago.   I'm using home remedies but it not helping. 3. SEVERITY: "How bad is the pain?"  (Scale 1-10; mild, moderate or severe)   - MILD (1-3): doesn't interfere with normal activities    - MODERATE (4-7): interferes with normal activities or awakens from sleep    - SEVERE (8-10): excruciating pain, unable to do any normal activities      9/10 4. URI SYMPTOMS: "Do you have a runny nose or cough?"     I have asthma and nasal sniffles especially on my left side.    My temple is popping and painful last night.    I recommended she go on to the urgent care. My insurance, I don't think, will over urgent care.   I instructed her to call her insur. Co. Number listed on the back of her insur. Card and see which urgent care she could go to.   I let her know she needed to be seen by a provider today.     5. FEVER: "Do you  have a fever?" If Yes, ask: "What is your temperature, how was it measured, and when did it start?"     Not asked 6. CAUSE: "Have you been swimming recently?", "How often do you use Q-TIPS?", "Have you had any recent air travel or scuba diving?"     Not asked 7. OTHER SYMPTOMS: "Do you have any other symptoms?" (e.g., headache, stiff neck, dizziness, vomiting, runny nose, decreased hearing)     Nasal sniffles but I have allergies.   Pain in temple and while chewing that is getting worse. 8. PREGNANCY: "Is there any chance you are pregnant?" "When was your last menstrual period?"     Not asked due to age  Protocols used: Earache-A-AH

## 2023-04-07 NOTE — Telephone Encounter (Signed)
  Chief Complaint: Left ear and temple pain for 2 weeks Symptoms: popping in temple area when chewing not getting better.   Frequency: For 2 weeks Pertinent Negatives: Patient  Disposition: [] ED /[x] Urgent Care (no appt availability in office) / [] Appointment(In office/virtual)/ []  [place] [facility]/ [] Home Care/ [] Refused Recommended Disposition /[] [place] [facility]/ []  Follow-up with PCP Additional Notes: Not established with [facility] until 04/16/2023.   I referred her to the urgent care.

## 2023-04-12 ENCOUNTER — Encounter (HOSPITAL_COMMUNITY): Payer: Self-pay

## 2023-04-12 ENCOUNTER — Emergency Department (HOSPITAL_COMMUNITY)
Admission: EM | Admit: 2023-04-12 | Discharge: 2023-04-12 | Disposition: A | Discharge status: Home / Self Care | Payer: Self-pay | Attending: Emergency Medicine | Admitting: Emergency Medicine

## 2023-04-12 ENCOUNTER — Other Ambulatory Visit: Payer: Self-pay

## 2023-04-12 DIAGNOSIS — K0889 Other specified disorders of teeth and supporting structures: Secondary | ICD-10-CM | POA: Insufficient documentation

## 2023-04-12 DIAGNOSIS — H9202 Otalgia, left ear: Secondary | ICD-10-CM | POA: Diagnosis not present

## 2023-04-12 MED ORDER — AMOXICILLIN-POT CLAVULANATE 875-125 MG PO TABS
1.0000 | ORAL_TABLET | Freq: Once | ORAL | Status: AC
  Administered 2023-04-12: 1 via ORAL
  Filled 2023-04-12: qty 1

## 2023-04-12 MED ORDER — AMOXICILLIN-POT CLAVULANATE 875-125 MG PO TABS: 1.0000 | ORAL_TABLET | Freq: Two times a day (BID) | ORAL | 0 refills | Status: AC

## 2023-04-12 NOTE — ED Provider Notes (Signed)
 [place] EMERGENCY DEPARTMENT AT [facility] Provider Note   CSN: [id number] Arrival date & time: 04/12/23  1919     History  Chief Complaint  Patient presents with   Otalgia    Debbie Leon is a 63 y.o. female.  Patient to ED with complaint of left ear pain for the past week. She also feels her left rear molar is sore. The discomfort extends to the left temple. Per chart review, the patient was seen at [facility] on 2/17 for same and prescribed prednisone and Mobic for TMJ. She states she never took either medication because she did not agree with the diagnosis.   The history is provided by the patient. No language interpreter was used.  Otalgia      Home Medications Prior to Admission medications   Medication Sig Start Date End Date Taking? Authorizing Provider  amoxicillin-clavulanate (AUGMENTIN) 875-125 MG tablet Take 1 tablet by mouth every 12 (twelve) hours. 04/12/23  Yes Elpidio Anis, PA-C  albuterol (PROVENTIL HFA;VENTOLIN HFA) 108 (90 Base) MCG/ACT inhaler Inhale 2 puffs into the lungs every 6 (six) hours as needed for wheezing or shortness of breath. 05/07/17   Orvil Feil, PA-C  cetirizine (ZYRTEC) 10 MG tablet Take 1 tablet (10 mg total) by mouth daily. 09/14/15   Cuthriell, Delorise Royals, PA-C  cyclobenzaprine (FLEXERIL) 10 MG tablet Take 1 tablet (10 mg total) by mouth 3 (three) times daily as needed. 05/28/19   Joni Reining, PA-C  fluticasone (FLONASE) 50 MCG/ACT nasal spray Place 1 spray into both nostrils 2 (two) times daily. 09/14/15   Cuthriell, Delorise Royals, PA-C  ibuprofen (ADVIL,MOTRIN) 600 MG tablet Take 1 tablet (600 mg total) every 6 (six) hours as needed by mouth. 12/23/16   Enid Derry, PA-C  Norgestim-Eth Estrad Triphasic (ORTHO TRI-CYCLEN LO PO) Take 1 tablet by mouth daily.    [provider]  ondansetron (ZOFRAN) 4 MG tablet Take 1 tablet (4 mg total) by mouth every 8 (eight) hours as needed for nausea or vomiting.  02/26/16   Jeanmarie Plant, MD  traMADol (ULTRAM) 50 MG tablet Take 1 tablet (50 mg total) by mouth every 6 (six) hours as needed for moderate pain. 05/28/19   Joni Reining, PA-C      Allergies    Dye fdc red Adele Schilder dye #40 (allura red)] and Shrimp [shellfish allergy]    Review of Systems   Review of Systems  HENT:  Positive for ear pain.     Physical Exam Updated Vital Signs BP (!) 156/68 (BP Location: Right Arm)   Pulse 73   Temp 98.2 F (36.8 C) (Oral)   Resp 16   Ht 5\' 6"  (1.676 m)   Wt 90.7 kg   LMP 02/12/2016   SpO2 99%   BMI 32.27 kg/m  Physical Exam Vitals and nursing note reviewed.  Constitutional:      Appearance: Normal appearance.  HENT:     Head: Normocephalic.     Comments: The left TMJ is not inflamed or red. Mildly tender to palpation.     Left Ear: Tympanic membrane normal.     Ears:     Comments: Mild inflammation associated with the tragus of the left ear. Canal is not swollen or red. No discharge or excessive cerumen in the canal. The ear is mildly painful to movement. No erythema. No pre- or post-auricular nodes.     Mouth/Throat:     Comments: Partially edentulous. No visualized abscess  or significant dental caries. No trismus Cardiovascular:     Rate and Rhythm: Normal rate.  Pulmonary:     Effort: Pulmonary effort is normal.  Neurological:     Mental Status: She is alert.     ED Results / Procedures / Treatments   Labs (all labs ordered are listed, but only abnormal results are displayed) Labs Reviewed - No data to display  EKG None  Radiology No results found.  Procedures Procedures    Medications Ordered in ED Medications  amoxicillin-clavulanate (AUGMENTIN) 875-125 MG per tablet 1 tablet (has no administration in time range)    ED Course/ Medical Decision Making/ A&P Clinical Course as of 04/12/23 2107  Sat Apr 12, 2023  2104 Patient with left ear and left jaw pain. No evidence of significant infection. Cannot r/o apical  abscess given dental tenderness. Will cover with Amoxil. Encouraged her to take Mobic as previously prescribed as she still has that prescription. Follow up dentistry.  [SU]    Clinical Course User Index [SU] Elpidio Anis, PA-C                                 Medical Decision Making          Final Clinical Impression(s) / ED Diagnoses Final diagnoses:  Pain, dental  Left ear pain    Rx / DC Orders ED Discharge Orders          Ordered    amoxicillin-clavulanate (AUGMENTIN) 875-125 MG tablet  Every 12 hours        04/12/23 2106              Elpidio Anis, PA-C 04/12/23 2108    Bethann Berkshire, MD 04/13/23 1121

## 2023-04-12 NOTE — ED Triage Notes (Signed)
 Pt to ED with c/o left ear pain and left bottom back tooth pain. Pt left ear slight redness noted. Pt says she also has bad tooth that needs to be filled.

## 2023-04-12 NOTE — Discharge Instructions (Signed)
 Follow up with a dentist of your choice for recheck of left molar pain. Take the antibiotic as prescribed. You can take Mobic as prescribed previously for pain and inflammation of the left ear. See you doctor if this continues.

## 2023-04-15 ENCOUNTER — Encounter (HOSPITAL_COMMUNITY): Payer: Self-pay

## 2023-04-15 ENCOUNTER — Emergency Department (HOSPITAL_COMMUNITY)
Admission: EM | Admit: 2023-04-15 | Discharge: 2023-04-16 | Disposition: A | Discharge status: Home / Self Care | Payer: BLUE CROSS/BLUE SHIELD | Attending: Emergency Medicine | Admitting: Emergency Medicine

## 2023-04-15 ENCOUNTER — Other Ambulatory Visit: Payer: Self-pay

## 2023-04-15 DIAGNOSIS — R519 Headache, unspecified: Secondary | ICD-10-CM | POA: Insufficient documentation

## 2023-04-15 DIAGNOSIS — J45909 Unspecified asthma, uncomplicated: Secondary | ICD-10-CM | POA: Insufficient documentation

## 2023-04-15 NOTE — ED Triage Notes (Addendum)
 Pt c/o left head pain when she moves her mouth of chews. States she can't eat. Pt was given antibiotics when seen. States that it is not any better. Pt states that she has a few bad teeth in her mouth.

## 2023-04-16 ENCOUNTER — Ambulatory Visit: Payer: BC Managed Care – PPO | Admitting: Family Medicine

## 2023-04-16 ENCOUNTER — Emergency Department (HOSPITAL_COMMUNITY): Payer: BLUE CROSS/BLUE SHIELD

## 2023-04-16 NOTE — ED Notes (Signed)
 Pt eating a snack and drinking water

## 2023-04-16 NOTE — ED Provider Notes (Signed)
 [facility] Emergency Department Provider Note MRN:  [id number]  Arrival date & time: 04/16/23     Chief Complaint   Facial pain History of Present Illness   Debbie Leon is a 63 y.o. year-old female with a history of asthma presenting to the ED with chief complaint of facial pain.  Pain to the left side of the face, mostly the jaw or TMJ area but pain radiates into the ear, up into the left temple.  Denies vision loss, describing new headaches recently and is concerned about her brain.  Has already been on antibiotics for her teeth and the pain is not going away.  Review of Systems  A thorough review of systems was obtained and all systems are negative except as noted in the HPI and PMH.   Patient's Health History    Past Medical History:  Diagnosis Date   Asthma     Past Surgical History:  Procedure Laterality Date   DENTAL SURGERY      History reviewed. No pertinent family history.  Social History   Socioeconomic History   Marital status: Divorced    Spouse name: Not on file   Number of children: Not on file   Years of education: Not on file   Highest education level: Not on file  Occupational History   Not on file  Tobacco Use   Smoking status: Never   Smokeless tobacco: Never  Substance and Sexual Activity   Alcohol use: No   Drug use: No   Sexual activity: Not on file  Other Topics Concern   Not on file  Social History Narrative   Not on file   Social Drivers of Health   Financial Resource Strain: Not on file  Food Insecurity: Not on file  Transportation Needs: Not on file  Physical Activity: Not on file  Stress: Not on file  Social Connections: Not on file  Intimate Partner Violence: Not on file     Physical Exam   Vitals:   04/15/23 2014 04/16/23 0059  BP: (!) 143/74 (!) 145/78  Pulse: 70 72  Resp: 17 17  Temp: 98.3 F (36.8 C) 98.3 F (36.8 C)  SpO2: 98% 98%    CONSTITUTIONAL: Well-appearing,  NAD NEURO/PSYCH:  Alert and oriented x 3, no focal deficits EYES:  eyes equal and reactive ENT/NECK:  no LAD, no JVD CARDIO: Regular rate, well-perfused, normal S1 and S2 PULM:  CTAB no wheezing or rhonchi GI/GU:  non-distended, non-tender MSK/SPINE:  No gross deformities, no edema SKIN:  no rash, atraumatic   *Additional and/or pertinent findings included in MDM below  Diagnostic and Interventional Summary    EKG Interpretation Date/Time:    Ventricular Rate:    PR Interval:    QRS Duration:    QT Interval:    QTC Calculation:   R Axis:      Text Interpretation:         Labs Reviewed - No data to display  CT HEAD WO CONTRAST ( )  Final Result    CT MAXILLOFACIAL WO CONTRAST  Final Result      Medications - No data to display   Procedures  /  Critical Care Procedures  ED Course and Medical Decision Making  Initial Impression and Ddx Suspect TMJ though patient is already taking meloxicam without relief.  Already has had antibiotics for presumed dental infection, not helping.  Having some new onset headaches and patient is 63 years old and is concerned about her  brain, shared decision making utilized and patient will undergo CT imaging to ensure no intracranial mass or other bony lesion.  Past medical/surgical history that increases complexity of ED encounter: None  Interpretation of Diagnostics CT imaging without acute process.  Patient Reassessment and Ultimate Disposition/Management     Discharge  Patient management required discussion with the following services or consulting groups:  None  Complexity of Problems Addressed Acute illness or injury that poses threat of life of bodily function  Additional Data Reviewed and Analyzed Further history obtained from: Prior ED visit notes and Prior labs/imaging results  Additional Factors Impacting ED Encounter Risk None  Elmer Sow. Pilar Plate, MD [facility] Emergency [facility]  Health [email or web address]  Final Clinical Impressions(s) / ED Diagnoses     ICD-10-CM   1. Facial pain  R51.9       ED Discharge Orders     None        Discharge Instructions Discussed with and Provided to Patient:     Discharge Instructions      You were evaluated in the Emergency Department and after careful evaluation, we did not find any emergent condition requiring admission or further testing in the hospital.  Your exam/testing today is overall reassuring.  Your CT scans did not show any abnormalities.  Recommend continued follow-up with your regular doctors to discuss her symptoms.  Continue the meloxicam.  Recommend soft foods, limiting your chewing/jaw movement.  Please return to the Emergency Department if you experience any worsening of your condition.   Thank you for allowing [place] to be a part of your care.       Sabas Sous, MD 04/16/23 [phone]

## 2023-04-16 NOTE — ED Notes (Signed)
 Patient transported to CT

## 2023-04-16 NOTE — Discharge Instructions (Signed)
 You were evaluated in the Emergency Department and after careful evaluation, we did not find any emergent condition requiring admission or further testing in the hospital.  Your exam/testing today is overall reassuring.  Your CT scans did not show any abnormalities.  Recommend continued follow-up with your regular doctors to discuss her symptoms.  Continue the meloxicam.  Recommend soft foods, limiting your chewing/jaw movement.  Please return to the Emergency Department if you experience any worsening of your condition.   Thank you for allowing [place] to be a part of your care.

## 2023-07-22 ENCOUNTER — Ambulatory Visit

## 2023-11-10 ENCOUNTER — Emergency Department

## 2023-11-10 ENCOUNTER — Other Ambulatory Visit: Payer: Self-pay

## 2023-11-10 DIAGNOSIS — J45909 Unspecified asthma, uncomplicated: Secondary | ICD-10-CM | POA: Diagnosis not present

## 2023-11-10 DIAGNOSIS — M79675 Pain in left toe(s): Secondary | ICD-10-CM | POA: Diagnosis present

## 2023-11-10 DIAGNOSIS — W5911XA Bitten by nonvenomous snake, initial encounter: Secondary | ICD-10-CM | POA: Insufficient documentation

## 2023-11-10 NOTE — ED Triage Notes (Signed)
 Pt reports she was getting in her car when she felt a sharp pain to her left great toe, pt repots she feels like she was bitten by something, no bite marks noted. Toe appears to be normal looking at this time

## 2023-11-11 ENCOUNTER — Emergency Department
Admission: EM | Admit: 2023-11-11 | Discharge: 2023-11-11 | Disposition: A | Discharge status: Home / Self Care | Attending: Emergency Medicine | Admitting: Emergency Medicine

## 2023-11-11 DIAGNOSIS — W5911XA Bitten by nonvenomous snake, initial encounter: Secondary | ICD-10-CM

## 2023-11-11 NOTE — ED Provider Notes (Signed)
 [facility] Provider Note    Event Date/Time   First MD Initiated Contact with Patient 11/11/23 0131     (approximate)   History   Chief Complaint Toe Pain   HPI  Debbie Leon is a 63 y.o. female with past medical history of asthma who presents to the ED complaining of toe pain.  Patient reports that just prior to arrival she was outside by her car when she felt a sudden sharp pain near her left great toe.  She looked down and saw a small snake slipping away.  Since then, she has had discomfort over the medial portion of her great toe but denies significant redness or swelling.     Physical Exam   Triage Vital Signs: ED Triage Vitals  Encounter Vitals Group     BP 11/10/23 2240 (!) 161/81     Girls Systolic BP Percentile --      Girls Diastolic BP Percentile --      Boys Systolic BP Percentile --      Boys Diastolic BP Percentile --      Pulse Rate 11/10/23 2240 68     Resp 11/10/23 2240 20     Temp 11/10/23 2240 98 F (36.7 C)     Temp src --      SpO2 11/10/23 2240 98 %     Weight 11/10/23 2239 190 lb (86.2 kg)     Height 11/10/23 2239 5' 5 (1.651 m)     Head Circumference --      Peak Flow --      Pain Score 11/10/23 2239 7     Pain Loc --      Pain Education --      Exclude from Growth Chart --     Most recent vital signs: Vitals:   11/10/23 2240  BP: (!) 161/81  Pulse: 68  Resp: 20  Temp: 98 F (36.7 C)  SpO2: 98%    Constitutional: Alert and oriented. Eyes: Conjunctivae are normal. Head: Atraumatic. Nose: No congestion/rhinnorhea. Mouth/Throat: Mucous membranes are moist.  Cardiovascular: Normal rate, regular rhythm. Grossly normal heart sounds.  2+ radial and DP pulses bilaterally. Respiratory: Normal respiratory effort.  No retractions. Lungs CTAB. Gastrointestinal: Soft and nontender. No distention. Musculoskeletal: Mild tenderness to the medial portion of the left great toe with no obvious bite marks, erythema,  or edema. Neurologic:  Normal speech and language. No gross focal neurologic deficits are appreciated.    ED Results / Procedures / Treatments   Labs (all labs ordered are listed, but only abnormal results are displayed) Labs Reviewed - No data to display   RADIOLOGY Left toe x-ray reviewed and interpreted by me with no fracture or dislocation.  PROCEDURES:  Critical Care performed: No  Procedures   MEDICATIONS ORDERED IN ED: Medications - No data to display   IMPRESSION / MDM / ASSESSMENT AND PLAN / ED COURSE  I reviewed the triage vital signs and the nursing notes.                              63 y.o. female with past medical history of asthma who presents to the ED complaining of sudden onset of pain at her left toe after seeing a snake.  Patient's presentation is most consistent with acute complicated illness / injury requiring diagnostic workup.  Differential diagnosis includes, but is not limited to, snake bite, envenomation, foreign body.  Patient nontoxic-appearing and in no acute distress, vital signs are unremarkable.  Presentation concerning for snake bite but very low suspicion for envenomation at this time given no significant edema or erythema now about 4 hours from the initial bite.  X-ray is unremarkable and patient appropriate for outpatient management with NSAIDs and ice.  She was counseled to return to the ED for new or worsening symptoms, patient agrees with plan.      FINAL CLINICAL IMPRESSION(S) / ED DIAGNOSES   Final diagnoses:  Snake bite, initial encounter     Rx / DC Orders   ED Discharge Orders     None        Note:  This document was prepared using Dragon voice recognition software and may include unintentional dictation errors.   Willo Dunnings, MD 11/11/23 [phone]
